# Patient Record
Sex: Female | Born: 1945
Health system: Southern US, Community
[De-identification: ages and names within clinical notes are randomized; demographics above are authoritative.]

## PROBLEM LIST (undated history)

## (undated) DIAGNOSIS — K760 Fatty (change of) liver, not elsewhere classified: Secondary | ICD-10-CM

## (undated) DIAGNOSIS — I10 Essential (primary) hypertension: Secondary | ICD-10-CM

## (undated) DIAGNOSIS — T7840XA Allergy, unspecified, initial encounter: Secondary | ICD-10-CM

## (undated) DIAGNOSIS — M1990 Unspecified osteoarthritis, unspecified site: Secondary | ICD-10-CM

## (undated) DIAGNOSIS — R911 Solitary pulmonary nodule: Secondary | ICD-10-CM

## (undated) DIAGNOSIS — K635 Polyp of colon: Secondary | ICD-10-CM

## (undated) DIAGNOSIS — D259 Leiomyoma of uterus, unspecified: Secondary | ICD-10-CM

## (undated) DIAGNOSIS — E079 Disorder of thyroid, unspecified: Secondary | ICD-10-CM

## (undated) DIAGNOSIS — K219 Gastro-esophageal reflux disease without esophagitis: Secondary | ICD-10-CM

## (undated) HISTORY — DX: Unspecified osteoarthritis, unspecified site: M19.90

## (undated) HISTORY — DX: Disorder of thyroid, unspecified: E07.9

## (undated) HISTORY — DX: Essential (primary) hypertension: I10

## (undated) HISTORY — DX: Leiomyoma of uterus, unspecified: D25.9

## (undated) HISTORY — PX: EYE SURGERY: SHX253

## (undated) HISTORY — DX: Gastro-esophageal reflux disease without esophagitis: K21.9

## (undated) HISTORY — DX: Polyp of colon: K63.5

## (undated) HISTORY — DX: Fatty (change of) liver, not elsewhere classified: K76.0

## (undated) HISTORY — DX: Allergy, unspecified, initial encounter: T78.40XA

## (undated) HISTORY — DX: Solitary pulmonary nodule: R91.1

## (undated) HISTORY — PX: DILATION AND CURETTAGE OF UTERUS: SHX78

## (undated) HISTORY — PX: OTHER SURGICAL HISTORY: SHX169

## (undated) HISTORY — PX: CATARACT EXTRACTION, BILATERAL: SHX1313

---

## 1997-05-17 ENCOUNTER — Other Ambulatory Visit: Admission: RE | Admit: 1997-05-17 | Discharge: 1997-05-17 | Payer: Self-pay | Admitting: Obstetrics and Gynecology

## 1998-10-07 ENCOUNTER — Other Ambulatory Visit: Admission: RE | Admit: 1998-10-07 | Discharge: 1998-10-07 | Payer: Self-pay | Admitting: Obstetrics and Gynecology

## 1999-11-17 ENCOUNTER — Other Ambulatory Visit: Admission: RE | Admit: 1999-11-17 | Discharge: 1999-11-17 | Payer: Self-pay | Admitting: Obstetrics and Gynecology

## 2001-01-05 ENCOUNTER — Other Ambulatory Visit: Admission: RE | Admit: 2001-01-05 | Discharge: 2001-01-05 | Payer: Self-pay | Admitting: Obstetrics and Gynecology

## 2001-01-26 HISTORY — PX: OTHER SURGICAL HISTORY: SHX169

## 2001-04-28 ENCOUNTER — Encounter: Admission: RE | Admit: 2001-04-28 | Discharge: 2001-04-28 | Payer: Self-pay | Admitting: Obstetrics and Gynecology

## 2001-04-28 ENCOUNTER — Encounter: Payer: Self-pay | Admitting: Obstetrics and Gynecology

## 2001-09-29 ENCOUNTER — Ambulatory Visit (HOSPITAL_BASED_OUTPATIENT_CLINIC_OR_DEPARTMENT_OTHER): Admission: RE | Admit: 2001-09-29 | Discharge: 2001-09-29 | Payer: Self-pay | Admitting: Orthopedic Surgery

## 2001-12-27 ENCOUNTER — Other Ambulatory Visit: Admission: RE | Admit: 2001-12-27 | Discharge: 2001-12-27 | Payer: Self-pay | Admitting: Obstetrics and Gynecology

## 2002-03-27 ENCOUNTER — Other Ambulatory Visit: Admission: RE | Admit: 2002-03-27 | Discharge: 2002-03-27 | Payer: Self-pay | Admitting: Obstetrics and Gynecology

## 2002-10-17 ENCOUNTER — Other Ambulatory Visit: Admission: RE | Admit: 2002-10-17 | Discharge: 2002-10-17 | Payer: Self-pay | Admitting: Obstetrics and Gynecology

## 2003-02-07 ENCOUNTER — Ambulatory Visit (HOSPITAL_COMMUNITY): Admission: RE | Admit: 2003-02-07 | Discharge: 2003-02-07 | Payer: Self-pay | Admitting: *Deleted

## 2004-04-02 ENCOUNTER — Other Ambulatory Visit: Admission: RE | Admit: 2004-04-02 | Discharge: 2004-04-02 | Payer: Self-pay | Admitting: Obstetrics and Gynecology

## 2005-04-30 ENCOUNTER — Ambulatory Visit (HOSPITAL_BASED_OUTPATIENT_CLINIC_OR_DEPARTMENT_OTHER): Admission: RE | Admit: 2005-04-30 | Discharge: 2005-04-30 | Payer: Self-pay | Admitting: Orthopedic Surgery

## 2006-04-29 ENCOUNTER — Other Ambulatory Visit: Admission: RE | Admit: 2006-04-29 | Discharge: 2006-04-29 | Payer: Self-pay | Admitting: Obstetrics and Gynecology

## 2007-10-14 ENCOUNTER — Other Ambulatory Visit: Admission: RE | Admit: 2007-10-14 | Discharge: 2007-10-14 | Payer: Self-pay | Admitting: Obstetrics and Gynecology

## 2009-05-13 ENCOUNTER — Other Ambulatory Visit: Admission: RE | Admit: 2009-05-13 | Discharge: 2009-05-13 | Payer: Self-pay | Admitting: Obstetrics and Gynecology

## 2010-06-13 NOTE — Op Note (Signed)
Cynthia Barnett, Cynthia Barnett NO.:  [id number]   MEDICAL RECORD NO.:  [id number]          PATIENT TYPE:  AMB   LOCATION:  DSC                          FACILITY:  [facility]   PHYSICIAN:  Cindee Salt, M.D.       DATE OF BIRTH:  1945-05-05   DATE OF PROCEDURE:  04/30/2005  DATE OF DISCHARGE:                                 OPERATIVE REPORT   PREOPERATIVE DIAGNOSIS:  Stenosing tenosynovitis, left thumb.   POSTOPERATIVE DIAGNOSIS:  Stenosing tenosynovitis, left thumb.   OPERATION:  Release A1 pulley, left thumb.   SURGEON:  Cindee Salt, M.D.   ASSISTANT:  None.   ANESTHESIA:  Forearm based IV regional.   HISTORY:  The patient is a 65 year old female with a history of triggering  of her left thumb.  This is locked.  She is admitted now for release.   PROCEDURE:  The patient is brought to the operating room where a forearm  based IV regional anesthetic was carried out without difficulty.  She was  prepped using DuraPrep, supine position, left arm free.  A transverse  incision was made over the A1 pulley, carried down through subcutaneous  tissue.  Radial and ulnar neurovascular bundles identified and protected to  the radial side of the A1 pulley and incision was made releasing the A1  pulley and noted tight nodule was present within the flexor tendon.  On  release there was complete flexion, complete extension.  The wound was  irrigated.  Skin was closed with interrupted 5-0 nylon sutures.  Sterile  compressive dressing was applied.  The patient tolerated the procedure well,  was taken to the recovery observation in satisfactory condition.  She is  discharged home to return to the [facility] [place] in 1 week on  Vicodin.           ______________________________  Cindee Salt, M.D.     GK/MEDQ  D:  04/30/2005  T:  05/01/2005  Job:  [id number]

## 2010-06-13 NOTE — Op Note (Signed)
NAMEMARTYNA, THORNS                          ACCOUNT NO.:  [id number]   MEDICAL RECORD NO.:  [id number]                   PATIENT TYPE:  AMB   LOCATION:  DSC                                  FACILITY:  [facility]   PHYSICIAN:  Nicki Reaper, M.D.                 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   POSTOPERATIVE DIAGNOSIS:  Locked trigger thumb and carpal tunnel syndrome,  right hand.   OPERATION:  Release of A-1 pulley, right thumb; carpal tunnel release, right  hand.   SURGEON:  Nicki Reaper, M.D.   ASSISTANT:  Joaquin Courts, R.N.   ANESTHESIA:  Forearm-based IV regional.   HISTORY:  The patient is a 65 year old female with a history of carpal  tunnel syndrome and a locked trigger thumb not responsive to conservative  treatment.   DESCRIPTION OF PROCEDURE:  The patient was brought to the operating room  where a forearm-based IV regional anesthetic was carried out without  difficulty with the right arm free.  A longitudinal incision was made in the  palm, carried down through subcutaneous tissue, bleeders were  electrocauterized, palmar fascia was split, superficial palmar arch  identified and flexor tendons to the ring and little fingers identified.  To  the ulnar side of the median nerve the carpal retinaculum was incised with  sharp dissection.  A right-angle and Sewall retractor were placed between  skin and forearm fascia and the fascia was released for approximately 3 cm  proximal to the wrist crease under direct vision.  Considerable scarring  about the nerve was present.  Tenosynovial tissue was moderately thickened.  The wound was irrigated and the skin was closed with interrupted 5-0 nylon  sutures.  A separate incision was then made transversely over the A-1 pulley  and carried through  subcutaneous tissue, neurovascular structures protected.  The A-1 pulley was identified, with a large nodule present in the flexor  tendon proximal to this.  With flexion, this was able to be released and  release performed on the radial aspect.  The oblique pulley was left intact.  The entire A-1 pulley was released.  The finger was able to be passed  through a full range of motion; no further triggering was identified.  The  wound was irrigated.  The skin was closed with interrupted 5-0 nylon suture.  A sterile compressive dressing and splint were applied.  The patient  tolerated the procedure well and was taken to the recovery room for  observation in satisfactory condition.   She is discharged home to return to the [facility] [place] in one  week on Vicoprofen and Keflex.  Nicki Reaper, M.D.    GRK/MEDQ  D:  09/29/2001  T:  09/30/2001  Job:  [id number]

## 2010-12-10 ENCOUNTER — Other Ambulatory Visit: Payer: Self-pay | Admitting: Family Medicine

## 2010-12-10 DIAGNOSIS — E049 Nontoxic goiter, unspecified: Secondary | ICD-10-CM

## 2011-01-06 ENCOUNTER — Ambulatory Visit
Admission: RE | Admit: 2011-01-06 | Discharge: 2011-01-06 | Disposition: A | Discharge status: Home / Self Care | Payer: BC Managed Care – PPO | Source: Ambulatory Visit | Attending: Family Medicine | Admitting: Family Medicine

## 2011-01-06 DIAGNOSIS — E049 Nontoxic goiter, unspecified: Secondary | ICD-10-CM

## 2011-05-08 DIAGNOSIS — N898 Other specified noninflammatory disorders of vagina: Secondary | ICD-10-CM | POA: Diagnosis not present

## 2011-05-08 DIAGNOSIS — N760 Acute vaginitis: Secondary | ICD-10-CM | POA: Diagnosis not present

## 2011-05-08 DIAGNOSIS — Z0000 Encounter for general adult medical examination without abnormal findings: Secondary | ICD-10-CM | POA: Diagnosis not present

## 2011-05-08 DIAGNOSIS — Z124 Encounter for screening for malignant neoplasm of cervix: Secondary | ICD-10-CM | POA: Diagnosis not present

## 2011-06-05 ENCOUNTER — Encounter: Payer: Self-pay | Admitting: Internal Medicine

## 2011-06-05 DIAGNOSIS — D259 Leiomyoma of uterus, unspecified: Secondary | ICD-10-CM | POA: Insufficient documentation

## 2011-06-05 DIAGNOSIS — Z0000 Encounter for general adult medical examination without abnormal findings: Secondary | ICD-10-CM | POA: Insufficient documentation

## 2011-06-09 ENCOUNTER — Other Ambulatory Visit (INDEPENDENT_AMBULATORY_CARE_PROVIDER_SITE_OTHER): Payer: Medicare Other

## 2011-06-09 ENCOUNTER — Encounter: Payer: Self-pay | Admitting: Internal Medicine

## 2011-06-09 ENCOUNTER — Ambulatory Visit (INDEPENDENT_AMBULATORY_CARE_PROVIDER_SITE_OTHER)
Admission: RE | Admit: 2011-06-09 | Discharge: 2011-06-09 | Disposition: A | Discharge status: Home / Self Care | Payer: Medicare Other | Source: Ambulatory Visit

## 2011-06-09 ENCOUNTER — Ambulatory Visit (INDEPENDENT_AMBULATORY_CARE_PROVIDER_SITE_OTHER): Payer: Medicare Other | Admitting: Internal Medicine

## 2011-06-09 VITALS — BP 122/80 | HR 81 | Temp 98.7°F | Ht 63.5 in | Wt 221.1 lb

## 2011-06-09 DIAGNOSIS — E039 Hypothyroidism, unspecified: Secondary | ICD-10-CM | POA: Insufficient documentation

## 2011-06-09 DIAGNOSIS — Z0000 Encounter for general adult medical examination without abnormal findings: Secondary | ICD-10-CM

## 2011-06-09 DIAGNOSIS — E1140 Type 2 diabetes mellitus with diabetic neuropathy, unspecified: Secondary | ICD-10-CM | POA: Insufficient documentation

## 2011-06-09 DIAGNOSIS — I10 Essential (primary) hypertension: Secondary | ICD-10-CM | POA: Diagnosis not present

## 2011-06-09 DIAGNOSIS — IMO0001 Reserved for inherently not codable concepts without codable children: Secondary | ICD-10-CM

## 2011-06-09 DIAGNOSIS — M1990 Unspecified osteoarthritis, unspecified site: Secondary | ICD-10-CM | POA: Insufficient documentation

## 2011-06-09 DIAGNOSIS — N959 Unspecified menopausal and perimenopausal disorder: Secondary | ICD-10-CM

## 2011-06-09 DIAGNOSIS — K635 Polyp of colon: Secondary | ICD-10-CM | POA: Insufficient documentation

## 2011-06-09 DIAGNOSIS — J309 Allergic rhinitis, unspecified: Secondary | ICD-10-CM | POA: Insufficient documentation

## 2011-06-09 LAB — CBC WITH DIFFERENTIAL/PLATELET
Basophils Relative: 0.5 % (ref 0.0–3.0)
Eosinophils Absolute: 0.3 10*3/uL (ref 0.0–0.7)
Eosinophils Relative: 3.8 % (ref 0.0–5.0)
Hemoglobin: 13.1 g/dL (ref 12.0–15.0)
Lymphocytes Relative: 32.3 % (ref 12.0–46.0)
MCHC: 33.5 g/dL (ref 30.0–36.0)
Neutro Abs: 3.7 10*3/uL (ref 1.4–7.7)
RBC: 4.41 Mil/uL (ref 3.87–5.11)

## 2011-06-09 LAB — LIPID PANEL
LDL Cholesterol: 53 mg/dL (ref 0–99)
Total CHOL/HDL Ratio: 2
Triglycerides: 40 mg/dL (ref 0.0–149.0)

## 2011-06-09 LAB — HEMOGLOBIN A1C: Hgb A1c MFr Bld: 7.4 % — ABNORMAL HIGH (ref 4.6–6.5)

## 2011-06-09 LAB — BASIC METABOLIC PANEL
CO2: 29 mEq/L (ref 19–32)
Calcium: 9.4 mg/dL (ref 8.4–10.5)
Creatinine, Ser: 0.7 mg/dL (ref 0.4–1.2)

## 2011-06-09 LAB — URINALYSIS, ROUTINE W REFLEX MICROSCOPIC
Specific Gravity, Urine: 1.025 (ref 1.000–1.030)
Total Protein, Urine: NEGATIVE
Urine Glucose: NEGATIVE
pH: 6 (ref 5.0–8.0)

## 2011-06-09 LAB — MICROALBUMIN / CREATININE URINE RATIO
Creatinine,U: 77.4 mg/dL
Microalb Creat Ratio: 1.5 mg/g (ref 0.0–30.0)

## 2011-06-09 LAB — HEPATIC FUNCTION PANEL
Bilirubin, Direct: 0.1 mg/dL (ref 0.0–0.3)
Total Bilirubin: 0.4 mg/dL (ref 0.3–1.2)
Total Protein: 7.4 g/dL (ref 6.0–8.3)

## 2011-06-09 MED ORDER — HYDROCHLOROTHIAZIDE 25 MG PO TABS: 25.0000 mg | ORAL_TABLET | Freq: Every day | ORAL | Status: DC

## 2011-06-09 MED ORDER — GLIPIZIDE 10 MG PO TABS: 10.0000 mg | ORAL_TABLET | Freq: Every day | ORAL | Status: DC

## 2011-06-09 MED ORDER — METFORMIN HCL 500 MG PO TABS: 500.0000 mg | ORAL_TABLET | Freq: Four times a day (QID) | ORAL | Status: DC

## 2011-06-09 MED ORDER — LEVOTHYROXINE SODIUM 112 MCG PO TABS: 112.0000 ug | ORAL_TABLET | Freq: Every day | ORAL | Status: DC

## 2011-06-09 NOTE — Progress Notes (Signed)
Subjective:    Patient ID: Cynthia Barnett, female    DOB: May 19, 1945, 67 y.o.   MRN: [id number]  HPI  Here to establish as new pt; overall doing ok,  Pt denies chest pain, increased sob or doe, wheezing, orthopnea, PND, increased LE swelling, palpitations, dizziness or syncope.  Pt denies new neurological symptoms such as new headache, or facial or extremity weakness or numbness   Pt denies polydipsia, polyuria, or low sugar symptoms such as weakness or confusion improved with po intake.  Pt states overall good compliance with meds, trying to follow lower cholesterol, diabetic diet, wt overall stable but little exercise however.   Last a1c about 6.7 per pt about 5 months ago.  Denies hyper or hypo thyroid symptoms such as voice, skin or hair change. Does have sense of ongoing fatigue, but denies signficant hypersomnolence.  Still working full time and part time (2 jobs) to make ends meet.  Needs records forwarded    Past Medical History  Diagnosis Date  . Uterine fibroid   . Colon polyps   . Allergy   . Arthritis   . Diabetes mellitus   . Hypertension   . Thyroid disease    Past Surgical History  Procedure Date  . Thumb surgury 2003    dr Merlyn Lot  . Carpal tunnel about 2005    right, Dr Merlyn Lot    reports that she has quit smoking. She has never used smokeless tobacco. She reports that she drinks alcohol. She reports that she does not use illicit drugs. family history includes Arthritis in her other; Cancer in her other; Diabetes in her other; Heart disease in her other; and Hypertension in her other. Allergies  Allergen Reactions  . Sulfa Antibiotics    Current Outpatient Prescriptions on File Prior to Visit  Medication Sig Dispense Refill  . glipiZIDE (GLUCOTROL) 10 MG tablet Take 1 tablet (10 mg total) by mouth daily.  90 tablet  3  . hydrochlorothiazide (HYDRODIURIL) 25 MG tablet Take 1 tablet (25 mg total) by mouth daily.  90 tablet  3  . levothyroxine (SYNTHROID, LEVOTHROID) 112 MCG  tablet Take 1 tablet (112 mcg total) by mouth daily.  90 tablet  3  . metFORMIN (GLUCOPHAGE) 500 MG tablet Take 1 tablet (500 mg total) by mouth 4 (four) times daily.  360 tablet  3   Review of Systems Review of Systems  Constitutional: Negative for diaphoresis, activity change, appetite change and unexpected weight change.  HENT: Negative for hearing loss, ear pain, facial swelling, mouth sores and neck stiffness.   Eyes: Negative for pain, redness and visual disturbance.  Respiratory: Negative for shortness of breath and wheezing.   Cardiovascular: Negative for chest pain and palpitations.  Gastrointestinal: Negative for diarrhea, blood in stool, abdominal distention and rectal pain.  Genitourinary: Negative for hematuria, flank pain and decreased urine volume.  Musculoskeletal: Negative for myalgias and joint swelling.  Skin: Negative for color change and wound.  Neurological: Negative for syncope and numbness.  Hematological: Negative for adenopathy.  Psychiatric/Behavioral: Negative for hallucinations, self-injury, decreased concentration and agitation.      Objective:   Physical Exam BP 122/80  Pulse 81  Temp(Src) 98.7 F (37.1 C) (Oral)  Ht 5' 3.5" (1.613 m)  Wt 221 lb 2 oz (100.302 kg)  BMI 38.56 kg/m2  SpO2 97% Physical Exam  VS noted Constitutional: Pt is oriented to person, place, and time. Appears well-developed and well-nourished.  HENT:  Head: Normocephalic and atraumatic.  Right Ear:  External ear normal.  Left Ear: External ear normal.  Nose: Nose normal.  Mouth/Throat: Oropharynx is clear and moist.  Eyes: Conjunctivae and EOM are normal. Pupils are equal, round, and reactive to light.  Neck: Normal range of motion. Neck supple. No JVD present. No tracheal deviation present.  Cardiovascular: Normal rate, regular rhythm, normal heart sounds and intact distal pulses.   Pulmonary/Chest: Effort normal and breath sounds normal.  Abdominal: Soft. Bowel sounds are  normal. There is no tenderness.  Musculoskeletal: Normal range of motion. Exhibits no edema.  Lymphadenopathy:  Has no cervical adenopathy.  Neurological: Pt is alert and oriented to person, place, and time. Pt has normal reflexes. No cranial nerve deficit.  Skin: Skin is warm and dry. No rash noted.  Psychiatric:  Has  normal mood and affect. Behavior is normal. 1+ nervous    Assessment & Plan:

## 2011-06-09 NOTE — Assessment & Plan Note (Signed)
Not charged today, up to date with immunizations;  Last mammogram may 2012, pap neg April 2013 - Dr Huel Cote

## 2011-06-09 NOTE — Patient Instructions (Signed)
Please sign the Release of information form for the [facility] Please start the Aspirin 81mg  per day  - COATED only Continue all other medications as before You are given the refills today Please go to LAB in the Basement for the blood and/or urine tests to be done today You will be contacted by phone if any changes need to be made immediately.  Otherwise, you will receive a letter about your results with an explanation. Please see the schedulers regarding scheduling of the Bone Density test Please return in 6 months, or sooner if needed

## 2011-06-13 ENCOUNTER — Encounter: Payer: Self-pay | Admitting: Internal Medicine

## 2011-06-13 NOTE — Assessment & Plan Note (Signed)
stable overall by hx and exam, most recent data reviewed with pt, and pt to continue medical treatment as before BP Readings from Last 3 Encounters:  06/09/11 122/80

## 2011-06-13 NOTE — Assessment & Plan Note (Signed)
duer for dxa - to be ordered,   to f/u any worsening symptoms or concerns

## 2011-06-13 NOTE — Assessment & Plan Note (Signed)
stable overall by hx and exam, most recent data reviewed with pt, and pt to continue medical treatment as before Lab Results  Component Value Date   TSH 1.68 06/09/2011

## 2011-06-13 NOTE — Assessment & Plan Note (Signed)
stable overall by hx and exam, most recent data reviewed with pt, and pt to continue medical treatment as before Lab Results  Component Value Date   HGBA1C 7.4* 06/09/2011

## 2011-06-23 ENCOUNTER — Telehealth: Payer: Self-pay | Admitting: Internal Medicine

## 2011-06-23 NOTE — Telephone Encounter (Signed)
Received 18 pages. Sent to Dr. Jonny Ruiz. sd 06/23/11

## 2011-06-26 ENCOUNTER — Encounter: Payer: Self-pay | Admitting: Internal Medicine

## 2011-07-06 ENCOUNTER — Telehealth: Payer: Self-pay

## 2011-07-06 MED ORDER — GLIPIZIDE ER 10 MG PO TB24: 10.0000 mg | ORAL_TABLET | Freq: Every day | ORAL | Status: DC

## 2011-07-06 NOTE — Telephone Encounter (Signed)
Pharmacy called requesting clarification on Glipizide. Pt has previously been taking extended release 10 mg tab but Rx received 05/14 was for plain. Please advise.

## 2011-07-06 NOTE — Telephone Encounter (Signed)
rx corrected - done erx 

## 2011-07-31 DIAGNOSIS — D239 Other benign neoplasm of skin, unspecified: Secondary | ICD-10-CM | POA: Diagnosis not present

## 2011-07-31 DIAGNOSIS — L919 Hypertrophic disorder of the skin, unspecified: Secondary | ICD-10-CM | POA: Diagnosis not present

## 2011-07-31 DIAGNOSIS — L723 Sebaceous cyst: Secondary | ICD-10-CM | POA: Diagnosis not present

## 2011-07-31 DIAGNOSIS — L909 Atrophic disorder of skin, unspecified: Secondary | ICD-10-CM | POA: Diagnosis not present

## 2011-07-31 DIAGNOSIS — L821 Other seborrheic keratosis: Secondary | ICD-10-CM | POA: Diagnosis not present

## 2011-10-21 DIAGNOSIS — Z1231 Encounter for screening mammogram for malignant neoplasm of breast: Secondary | ICD-10-CM | POA: Diagnosis not present

## 2011-11-16 DIAGNOSIS — Z23 Encounter for immunization: Secondary | ICD-10-CM | POA: Diagnosis not present

## 2011-12-08 ENCOUNTER — Ambulatory Visit: Payer: Medicare Other | Admitting: Internal Medicine

## 2012-01-06 ENCOUNTER — Ambulatory Visit: Payer: Medicare Other | Admitting: Internal Medicine

## 2012-01-22 ENCOUNTER — Other Ambulatory Visit: Payer: Self-pay | Admitting: *Deleted

## 2012-01-22 DIAGNOSIS — M25561 Pain in right knee: Secondary | ICD-10-CM

## 2012-01-27 LAB — HM MAMMOGRAPHY: HM Mammogram: NORMAL

## 2012-01-28 ENCOUNTER — Ambulatory Visit
Admission: RE | Admit: 2012-01-28 | Discharge: 2012-01-28 | Disposition: A | Discharge status: Home / Self Care | Payer: Medicare Other | Source: Ambulatory Visit | Attending: Sports Medicine | Admitting: Sports Medicine

## 2012-01-28 DIAGNOSIS — M1710 Unilateral primary osteoarthritis, unspecified knee: Secondary | ICD-10-CM | POA: Diagnosis not present

## 2012-02-03 ENCOUNTER — Ambulatory Visit (INDEPENDENT_AMBULATORY_CARE_PROVIDER_SITE_OTHER): Payer: Medicare Other | Admitting: Sports Medicine

## 2012-02-03 ENCOUNTER — Encounter: Payer: Self-pay | Admitting: Sports Medicine

## 2012-02-03 VITALS — BP 126/79 | HR 76 | Ht 63.0 in

## 2012-02-03 DIAGNOSIS — M1710 Unilateral primary osteoarthritis, unspecified knee: Secondary | ICD-10-CM | POA: Diagnosis not present

## 2012-02-03 DIAGNOSIS — IMO0002 Reserved for concepts with insufficient information to code with codable children: Secondary | ICD-10-CM

## 2012-02-03 DIAGNOSIS — M25569 Pain in unspecified knee: Secondary | ICD-10-CM

## 2012-02-03 DIAGNOSIS — M1711 Unilateral primary osteoarthritis, right knee: Secondary | ICD-10-CM

## 2012-02-03 NOTE — Progress Notes (Signed)
  Subjective:    Patient ID: Cynthia Barnett, female    DOB: 27-Sep-1945, 67 y.o.   MRN: [id number]  HPI chief complaint: Right knee pain   67 year old female comes in today requesting repeat Supartz injections. She is a patient of mine for [facility]. I've seen her in the past for knee osteoarthritis. She's had cortisone injections as well as Visco supplementation. She does very well with Visco supplementation. Last series was done about a year and a half ago. Right knee pain began to return a few weeks ago. It is identical in nature to what she has experienced previously. No recent trauma. Some swelling. Since my last visit with her she has stopped working at [employer] so she is not having to stand as much as she once was. She's had a total of 3 previous Supartz injections in the right knee. No injections in the left knee. Her knee pain is worse with standing and improves with sitting. No mechanical symptom.  Past medical history significant for hypertension, diabetes, hypothyroidism Medications are reviewed She is allergic to sulfa drugs  Socially she does not smoke, drinks a glass of wine once a week, and works as an Print production planner    Review of Systems     Objective:   Physical Exam Well-developed, well-nourished. No acute distress. Awake alert and oriented x3. Vital signs are reviewed  Right knee: Full range of motion. Trace effusion. 1+ patellofemoral crepitus. Slight tenderness to palpation along the medial joint line. Negative McMurray's. Joint is stable to ligamentous exam. Neurovascular intact distally.  Left knee: Full range of motion. No effusion. 1+ patellofemoral crepitus. No joint line tenderness. Negative McMurray's. Joint is stable to ligamentous exam. Neurovascular intact distally .  Patient walks with a slight limp.  X-rays including standing AP and lateral views of each knee are reviewed. Films are dated 01/28/2011. There are changes in the medial  compartment of each knee consistent with mild DJD. Nothing acute is seen.      Assessment & Plan:  1. Right knee pain secondary to DJD  Will start a new round of Supartz today. First injection was done using an anterior lateral approach. Patient tolerated this without difficulty. Followup next week for the second injection. We will plan on doing a total of 3. Call with questions or concerns in the interim.

## 2012-02-10 ENCOUNTER — Ambulatory Visit (INDEPENDENT_AMBULATORY_CARE_PROVIDER_SITE_OTHER): Payer: Medicare Other | Admitting: Internal Medicine

## 2012-02-10 ENCOUNTER — Encounter: Payer: Self-pay | Admitting: Internal Medicine

## 2012-02-10 ENCOUNTER — Other Ambulatory Visit (INDEPENDENT_AMBULATORY_CARE_PROVIDER_SITE_OTHER): Payer: Medicare Other

## 2012-02-10 VITALS — BP 132/78 | HR 77 | Temp 98.9°F | Ht 63.0 in | Wt 219.2 lb

## 2012-02-10 DIAGNOSIS — Z0000 Encounter for general adult medical examination without abnormal findings: Secondary | ICD-10-CM

## 2012-02-10 DIAGNOSIS — H9192 Unspecified hearing loss, left ear: Secondary | ICD-10-CM | POA: Insufficient documentation

## 2012-02-10 DIAGNOSIS — H9190 Unspecified hearing loss, unspecified ear: Secondary | ICD-10-CM | POA: Diagnosis not present

## 2012-02-10 DIAGNOSIS — IMO0001 Reserved for inherently not codable concepts without codable children: Secondary | ICD-10-CM

## 2012-02-10 DIAGNOSIS — J309 Allergic rhinitis, unspecified: Secondary | ICD-10-CM

## 2012-02-10 DIAGNOSIS — I10 Essential (primary) hypertension: Secondary | ICD-10-CM

## 2012-02-10 LAB — BASIC METABOLIC PANEL
BUN: 21 mg/dL (ref 6–23)
CO2: 27 mEq/L (ref 19–32)
Calcium: 9.5 mg/dL (ref 8.4–10.5)
Creatinine, Ser: 0.7 mg/dL (ref 0.4–1.2)

## 2012-02-10 LAB — LIPID PANEL
HDL: 57.1 mg/dL (ref >39.00)
Total CHOL/HDL Ratio: 2
Triglycerides: 37 mg/dL (ref 0.0–149.0)

## 2012-02-10 MED ORDER — FEXOFENADINE HCL 180 MG PO TABS: 180.0000 mg | ORAL_TABLET | Freq: Every day | ORAL | Status: DC

## 2012-02-10 NOTE — Progress Notes (Signed)
Subjective:    Patient ID: Cynthia Barnett, female    DOB: Jun 17, 1945, 67 y.o.   MRN: [id number]  HPI  Here to f/u; overall doing ok,  Pt denies chest pain, increased sob or doe, wheezing, orthopnea, PND, increased LE swelling, palpitations, dizziness or syncope.  Pt denies polydipsia, polyuria, or low sugar symptoms such as weakness or confusion improved with po intake.  Pt denies new neurological symptoms such as new headache, or facial or extremity weakness or numbness.   Pt states overall good compliance with meds, has been trying to follow lower cholesterol, diabetic diet, with overall stable,  but little exercise however.  Has been trying to follow a lower chol diet, but with more meats.  Has had worsening left hearing loss in the past week, without pain, veritgo, fever.  Does have several wks ongoing nasal allergy symptoms with clearish congestion, itch and sneezing, without fever, pain, ST, cough, swelling or wheezing. Past Medical History  Diagnosis Date  . Uterine fibroid   . Colon polyps   . Allergy   . Arthritis   . Diabetes mellitus   . Hypertension   . Thyroid disease    Past Surgical History  Procedure Date  . Thumb surgury 2003    dr Merlyn Lot  . Carpal tunnel about 2005    right, Dr Merlyn Lot    reports that she has quit smoking. She has never used smokeless tobacco. She reports that she drinks alcohol. She reports that she does not use illicit drugs. family history includes Arthritis in her other; Cancer in her other; Diabetes in her other; Heart disease in her other; and Hypertension in her other. Allergies  Allergen Reactions  . Sulfa Antibiotics    Current Outpatient Prescriptions on File Prior to Visit  Medication Sig Dispense Refill  . glipiZIDE (GLUCOTROL XL) 10 MG 24 hr tablet Take 1 tablet (10 mg total) by mouth daily.  90 tablet  3  . hydrochlorothiazide (HYDRODIURIL) 25 MG tablet Take 1 tablet (25 mg total) by mouth daily.  90 tablet  3  . levothyroxine (SYNTHROID,  LEVOTHROID) 112 MCG tablet Take 1 tablet (112 mcg total) by mouth daily.  90 tablet  3  . metFORMIN (GLUCOPHAGE) 500 MG tablet Take 1 tablet (500 mg total) by mouth 4 (four) times daily.  360 tablet  3  . Misc Natural Products (TART CHERRY ADVANCED PO) Take by mouth daily.      . Multiple Vitamin (MULTIVITAMIN) tablet Take 1 tablet by mouth daily.      . Omega-3 Fatty Acids (OMEGA 3 PO) Take by mouth daily.      . fexofenadine (ALLEGRA) 180 MG tablet Take 1 tablet (180 mg total) by mouth daily.  90 tablet  3   Review of Systems  Constitutional: Negative for unexpected weight change, or unusual diaphoresis  HENT: Negative for tinnitus.   Eyes: Negative for photophobia and visual disturbance.  Respiratory: Negative for choking and stridor.   Gastrointestinal: Negative for vomiting and blood in stool.  Genitourinary: Negative for hematuria and decreased urine volume.  Musculoskeletal: Negative for acute joint swelling Skin: Negative for color change and wound.  Neurological: Negative for tremors and numbness other than noted  Psychiatric/Behavioral: Negative for decreased concentration or  hyperactivity.       Objective:   Physical Exam BP 132/78  Pulse 77  Temp 98.9 F (37.2 C) (Oral)  Ht 5\' 3"  (1.6 m)  Wt 219 lb 4 oz (99.451 kg)  BMI 38.84 kg/m2  SpO2 97% VS noted,  Constitutional: Pt appears well-developed and well-nourished.  HENT: Head: NCAT.  Right Ear: External ear normal.  Left Ear: External ear normal.  Left canal irrigated of wax, hearing improved Bilat tm's without erythema.  Max sinus areas non tender.  Pharynx with mild erythema, no exudate Eyes: Conjunctivae and EOM are normal. Pupils are equal, round, and reactive to light.  Neck: Normal range of motion. Neck supple.  Cardiovascular: Normal rate and regular rhythm.   Pulmonary/Chest: Effort normal and breath sounds normal.  Neurological: Pt is alert. Not confused  Skin: Skin is warm. No erythema.  Psychiatric:  Pt behavior is normal. Thought content normal.     Assessment & Plan:

## 2012-02-10 NOTE — Assessment & Plan Note (Signed)
With hearin gimproved s/o irrigation,  to f/u any worsening symptoms or concerns

## 2012-02-10 NOTE — Assessment & Plan Note (Addendum)
ECG reviewed as per emr, stable overall by history and exam, recent data reviewed with pt, and pt to continue medical treatment as before,  to f/u any worsening symptoms or concerns BP Readings from Last 3 Encounters:  02/10/12 132/78  02/03/12 126/79  06/09/11 122/80

## 2012-02-10 NOTE — Assessment & Plan Note (Signed)
Ok to add allegra qd prn,  to f/u any worsening symptoms or concerns

## 2012-02-10 NOTE — Patient Instructions (Addendum)
Your left ear was irrigated of wax today Please take all new medication as prescribed Please continue all other medications as before, and refills have been done if requested. Please go to the LAB in the Basement (turn left off the elevator) for the tests to be done today You will be contacted by phone if any changes need to be made immediately.  Otherwise, you will receive a letter about your results with an explanation, but please check with MyChart first. Thank you for enrolling in MyChart. Please follow the instructions below to securely access your online medical record. MyChart allows you to send messages to your doctor, view your test results, renew your prescriptions, schedule appointments, and more. To Log into My Chart online, please go by [employer] or [employer] to [email or web address], or download the MyChart App from the [employer] of [employer] .  Your Username is:  [email or web address] (pass beezer) Please send a practice email message on Mychart later today.  Please return in 6 months, or sooner if needed

## 2012-02-10 NOTE — Assessment & Plan Note (Signed)
stable overall by history and exam, recent data reviewed with pt, and pt to continue medical treatment as before,  to f/u any worsening symptoms or concerns Lab Results  Component Value Date   HGBA1C 7.4* 06/09/2011   For labs today, cont wt los efforts

## 2012-02-11 ENCOUNTER — Ambulatory Visit (INDEPENDENT_AMBULATORY_CARE_PROVIDER_SITE_OTHER): Payer: Medicare Other | Admitting: Sports Medicine

## 2012-02-11 VITALS — BP 147/85 | Ht 63.0 in | Wt 200.0 lb

## 2012-02-11 DIAGNOSIS — IMO0002 Reserved for concepts with insufficient information to code with codable children: Secondary | ICD-10-CM | POA: Diagnosis not present

## 2012-02-11 DIAGNOSIS — M1710 Unilateral primary osteoarthritis, unspecified knee: Secondary | ICD-10-CM

## 2012-02-11 NOTE — Progress Notes (Signed)
  Subjective:    Patient ID: Cynthia Barnett, female    DOB: 05-10-1945, 67 y.o.   MRN: [id number]  HPI Patient comes in today for her second Supartz injection into her right knee. She tolerated the first injection last week without any difficulty. She is leaving in a couple weeks for trip to [place]. She would like to have a prescription for hydrocodone just in case her knee bothers her while she is away. She has had good success with this in the past.    Review of Systems     Objective:   Physical Exam Unchanged from last week's exam       Assessment & Plan:  1. Right knee pain secondary to DJD  Patient's right knee is injected with her second Supartz injection. Patient tolerated this without difficulty. She will followup with me next week for her third injection. I will give her a refill on her hydrocodone to take with her on her trip. She will call with questions or concerns in the interim.  Consent obtained and verified. Time-out conducted. Noted no overlying erythema, induration, or other signs of local infection. Skin prepped in a sterile fashion. Topical analgesic spray: Ethyl chloride. Joint: right knee, anterior medial approach Needle: 22g 1 1/2 inch Completed without difficulty. Meds: 3cc 1% xylocaine, 1 vial supartz  Advised to call if fevers/chills, erythema, induration, drainage, or persistent bleeding.

## 2012-02-18 ENCOUNTER — Ambulatory Visit (INDEPENDENT_AMBULATORY_CARE_PROVIDER_SITE_OTHER): Payer: Medicare Other | Admitting: Sports Medicine

## 2012-02-18 VITALS — BP 128/78 | Ht 63.0 in | Wt 200.0 lb

## 2012-02-18 DIAGNOSIS — M1710 Unilateral primary osteoarthritis, unspecified knee: Secondary | ICD-10-CM

## 2012-02-18 DIAGNOSIS — IMO0002 Reserved for concepts with insufficient information to code with codable children: Secondary | ICD-10-CM | POA: Diagnosis not present

## 2012-02-18 MED ORDER — HYDROCODONE-ACETAMINOPHEN 5-325 MG PO TABS: ORAL_TABLET | ORAL | Status: DC

## 2012-02-18 NOTE — Progress Notes (Signed)
  Subjective:    Patient ID: Cynthia Barnett, female    DOB: 12/01/1945, 67 y.o.   MRN: [id number]  HPI Patient comes in today for her third Supartz injection into her right knee. She is already beginning to experience some symptom relief. She is leaving next week for a cruise. She is requesting a refill on her hydrocodone. She has taken this responsibly in the past.    Review of Systems     Objective:   Physical Exam Unchanged from previous exam       Assessment & Plan:  1. Right knee pain secondary to DJD  Patient's right knee is injected today with her third Supartz injection. This is done using an anterior medial approach. Patient tolerated this without difficulty. Prescription for Vicodin 5/320 was provided for her to take on a when necessary basis. We will tentatively schedule a followup appointment in 4 weeks. If patient's symptoms have resolved she may cancel the appointment. If symptoms persist however, he will consider completing the series of injections.

## 2012-03-12 ENCOUNTER — Other Ambulatory Visit: Payer: Self-pay

## 2012-03-17 ENCOUNTER — Ambulatory Visit (INDEPENDENT_AMBULATORY_CARE_PROVIDER_SITE_OTHER): Payer: Medicare Other | Admitting: Sports Medicine

## 2012-03-17 ENCOUNTER — Encounter: Payer: Self-pay | Admitting: Sports Medicine

## 2012-03-17 VITALS — BP 125/77 | HR 84 | Ht 63.0 in | Wt 200.0 lb

## 2012-03-17 DIAGNOSIS — IMO0002 Reserved for concepts with insufficient information to code with codable children: Secondary | ICD-10-CM | POA: Diagnosis not present

## 2012-03-17 DIAGNOSIS — M179 Osteoarthritis of knee, unspecified: Secondary | ICD-10-CM

## 2012-03-17 DIAGNOSIS — M1710 Unilateral primary osteoarthritis, unspecified knee: Secondary | ICD-10-CM

## 2012-03-18 NOTE — Progress Notes (Signed)
  Subjective:    Patient ID: Cynthia Barnett, female    DOB: 16-Jun-1945, 67 y.o.   MRN: [id number]  HPI Patient comes in today for followup. She has completed 3 Supartz injections and was doing very well until her trip to [place]. She did quite a bit of walking and her pain has begun to return. She would like to go ahead and complete the Supartz series.    Review of Systems     Objective:   Physical Exam Well-developed, well-nourished. No acute distress.  Right knee: Full range of motion. No effusion. Tenderness along the medial joint line but a negative McMurray's. Neurovascular intact distally. Walking with a slight limp.       Assessment & Plan:  1. Right knee pain secondary to DJD  Patient's knee was injected with her fourth Supartz injection. This was done using an anterior medial approach. She will return next week for her fifth and final injection. Call with questions or concerns in the interim.  Consent obtained and verified. Time-out conducted. Noted no overlying erythema, induration, or other signs of local infection. Skin prepped in a sterile fashion. Topical analgesic spray: Ethyl chloride. Joint: right knee Needle: 22g 11/2 inch Completed without difficulty. Meds: 3cc 1% lidocaine followed by 1 vial of supartz  Advised to call if fevers/chills, erythema, induration, drainage, or persistent bleeding.

## 2012-03-23 ENCOUNTER — Encounter: Payer: Self-pay | Admitting: Sports Medicine

## 2012-03-23 ENCOUNTER — Ambulatory Visit (INDEPENDENT_AMBULATORY_CARE_PROVIDER_SITE_OTHER): Payer: Medicare Other | Admitting: Sports Medicine

## 2012-03-23 VITALS — BP 119/82 | HR 76 | Ht 63.0 in | Wt 200.0 lb

## 2012-03-23 DIAGNOSIS — M1710 Unilateral primary osteoarthritis, unspecified knee: Secondary | ICD-10-CM

## 2012-03-23 DIAGNOSIS — IMO0002 Reserved for concepts with insufficient information to code with codable children: Secondary | ICD-10-CM

## 2012-03-23 NOTE — Progress Notes (Signed)
Patient ID: Cynthia Barnett, female   DOB: 1945/12/31, 67 y.o.   MRN: [id number]  Patient comes in today for her fifth and final Supartz injection into her right knee.  Examination of the right knee is unchanged from previous exams with the exception of a small ecchymotic area in the medial aspect of the knee from last week's injection. No signs of infection.  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 This is done atraumatically using an anterior lateral approach. We can repeat the series in 6 months or longer as long as her insurance continues to cover this procedure. She will followup when necessary.  Consent obtained and verified. Time-out conducted. Noted no overlying erythema, induration, or other signs of local infection. Skin prepped in a sterile fashion. Topical analgesic spray: Ethyl chloride. Joint: right knee Needle: 22g 1 1/2 inch Completed without difficulty. Meds: 3cc 1% lidocaine, 1 vial supartz  Advised to call if fevers/chills, erythema, induration, drainage, or persistent bleeding.

## 2012-07-04 ENCOUNTER — Other Ambulatory Visit: Payer: Self-pay | Admitting: Internal Medicine

## 2012-08-10 ENCOUNTER — Ambulatory Visit: Payer: Medicare Other | Admitting: Internal Medicine

## 2012-08-25 ENCOUNTER — Encounter: Payer: Self-pay | Admitting: Internal Medicine

## 2012-08-25 ENCOUNTER — Other Ambulatory Visit (INDEPENDENT_AMBULATORY_CARE_PROVIDER_SITE_OTHER): Payer: Medicare Other

## 2012-08-25 ENCOUNTER — Other Ambulatory Visit: Payer: Self-pay | Admitting: Internal Medicine

## 2012-08-25 ENCOUNTER — Ambulatory Visit (INDEPENDENT_AMBULATORY_CARE_PROVIDER_SITE_OTHER): Payer: Medicare Other | Admitting: Internal Medicine

## 2012-08-25 VITALS — BP 140/80 | HR 76 | Temp 97.9°F | Ht 63.0 in | Wt 223.5 lb

## 2012-08-25 DIAGNOSIS — IMO0001 Reserved for inherently not codable concepts without codable children: Secondary | ICD-10-CM

## 2012-08-25 DIAGNOSIS — E785 Hyperlipidemia, unspecified: Secondary | ICD-10-CM

## 2012-08-25 DIAGNOSIS — I10 Essential (primary) hypertension: Secondary | ICD-10-CM | POA: Diagnosis not present

## 2012-08-25 DIAGNOSIS — E039 Hypothyroidism, unspecified: Secondary | ICD-10-CM

## 2012-08-25 DIAGNOSIS — K219 Gastro-esophageal reflux disease without esophagitis: Secondary | ICD-10-CM

## 2012-08-25 HISTORY — DX: Gastro-esophageal reflux disease without esophagitis: K21.9

## 2012-08-25 LAB — URINALYSIS, ROUTINE W REFLEX MICROSCOPIC
Bilirubin Urine: NEGATIVE
Hgb urine dipstick: NEGATIVE
Ketones, ur: NEGATIVE
Leukocytes, UA: NEGATIVE
Nitrite: NEGATIVE
Urobilinogen, UA: 0.2 (ref 0.0–1.0)

## 2012-08-25 LAB — HEPATIC FUNCTION PANEL
AST: 58 U/L — ABNORMAL HIGH (ref 0–37)
Alkaline Phosphatase: 67 U/L (ref 39–117)
Bilirubin, Direct: 0.1 mg/dL (ref 0.0–0.3)

## 2012-08-25 LAB — BASIC METABOLIC PANEL
Calcium: 9.7 mg/dL (ref 8.4–10.5)
GFR: 91.85 mL/min (ref >60.00)
Glucose, Bld: 225 mg/dL — ABNORMAL HIGH (ref 70–99)
Potassium: 4.2 mEq/L (ref 3.5–5.1)
Sodium: 137 mEq/L (ref 135–145)

## 2012-08-25 LAB — CBC WITH DIFFERENTIAL/PLATELET
Basophils Absolute: 0 10*3/uL (ref 0.0–0.1)
Eosinophils Absolute: 0.3 10*3/uL (ref 0.0–0.7)
Eosinophils Relative: 4.4 % (ref 0.0–5.0)
HCT: 40 % (ref 36.0–46.0)
Lymphs Abs: 1.8 10*3/uL (ref 0.7–4.0)
MCV: 90.1 fl (ref 78.0–100.0)
Monocytes Absolute: 0.4 10*3/uL (ref 0.1–1.0)
Neutrophils Relative %: 58.3 % (ref 43.0–77.0)
Platelets: 272 10*3/uL (ref 150.0–400.0)
RDW: 14.7 % — ABNORMAL HIGH (ref 11.5–14.6)
WBC: 6.1 10*3/uL (ref 4.5–10.5)

## 2012-08-25 LAB — TSH: TSH: 9.7 u[IU]/mL — ABNORMAL HIGH (ref 0.35–5.50)

## 2012-08-25 LAB — LIPID PANEL
HDL: 50.5 mg/dL (ref >39.00)
Total CHOL/HDL Ratio: 2
VLDL: 8.2 mg/dL (ref 0.0–40.0)

## 2012-08-25 LAB — MICROALBUMIN / CREATININE URINE RATIO
Creatinine,U: 114.4 mg/dL
Microalb Creat Ratio: 1.3 mg/g (ref 0.0–30.0)

## 2012-08-25 LAB — HEMOGLOBIN A1C: Hgb A1c MFr Bld: 9 % — ABNORMAL HIGH (ref 4.6–6.5)

## 2012-08-25 MED ORDER — METFORMIN HCL 500 MG PO TABS: 500.0000 mg | ORAL_TABLET | Freq: Four times a day (QID) | ORAL | Status: DC

## 2012-08-25 MED ORDER — LEVOTHYROXINE SODIUM 112 MCG PO TABS: 112.0000 ug | ORAL_TABLET | Freq: Every day | ORAL | Status: DC

## 2012-08-25 MED ORDER — LEVOTHYROXINE SODIUM 125 MCG PO TABS: 125.0000 ug | ORAL_TABLET | Freq: Every day | ORAL | Status: DC

## 2012-08-25 MED ORDER — HYDROCHLOROTHIAZIDE 25 MG PO TABS: 25.0000 mg | ORAL_TABLET | Freq: Every day | ORAL | Status: DC

## 2012-08-25 MED ORDER — SITAGLIPTIN PHOSPHATE 100 MG PO TABS: 100.0000 mg | ORAL_TABLET | Freq: Every day | ORAL | Status: DC

## 2012-08-25 NOTE — Assessment & Plan Note (Signed)
Ok to cont pepcid ac prn,  to f/u any worsening symptoms or concerns

## 2012-08-25 NOTE — Patient Instructions (Signed)
Please continue all other medications as before, and refills have been done if requested. Please have the pharmacy call with any other refills you may need.  Please continue your efforts at being more active, low cholesterol diet, and weight control.  You are otherwise up to date with prevention measures today.  Please go to the LAB in the Basement (turn left off the elevator) for the tests to be done today You will be contacted by phone if any changes need to be made immediately.  Otherwise, you will receive a letter about your results with an explanation, but please check with MyChart first.  Please remember to sign up for MyChart if you have not done so, as this will be important to you in the future with finding out test results, communicating by private email, and scheduling acute appointments online when needed.  Please return in 6 months, or sooner if needed, with Lab testing done 3-5 days before  

## 2012-08-25 NOTE — Assessment & Plan Note (Signed)
stable overall by history and exam, recent data reviewed with pt, and pt to continue medical treatment as before,  to f/u any worsening symptoms or concerns Lab Results  Component Value Date   HGBA1C 7.5* 02/10/2012

## 2012-08-25 NOTE — Assessment & Plan Note (Signed)
stable overall by history and exam, recent data reviewed with pt, and pt to continue medical treatment as before,  to f/u any worsening symptoms or concerns BP Readings from Last 3 Encounters:  08/25/12 140/80  03/23/12 119/82  03/17/12 125/77

## 2012-08-25 NOTE — Assessment & Plan Note (Signed)
stable overall by history and exam, recent data reviewed with pt, and pt to continue medical treatment as before,  to f/u any worsening symptoms or concerns Lab Results  Component Value Date   TSH 1.68 06/09/2011   For f/u lab

## 2012-08-25 NOTE — Progress Notes (Signed)
  Subjective:    Patient ID: Cynthia Barnett, female    DOB: 19-Jan-1946, 67 y.o.   MRN: [id number]  HPI  Here for f/u;  Overall doing ok;  Pt denies CP, worsening SOB, DOE, wheezing, orthopnea, PND, worsening LE edema, palpitations, dizziness or syncope.  Pt denies neurological change such as new headache, facial or extremity weakness.  Pt denies polydipsia, polyuria, or low sugar symptoms. Pt states overall good compliance with treatment and medications, good tolerability, and has been trying to follow lower cholesterol diet.  Pt denies worsening depressive symptoms, suicidal ideation or panic. No fever, night sweats, wt loss, loss of appetite, or other constitutional symptoms.  Pt states good ability with ADL's, has low fall risk, home safety reviewed and adequate, no other significant changes in hearing or vision, and only occasionally active with exercise.  BP borderline but has ran out of med yesterday. Has had mild worsening reflux better with pepcid AC prn, without abd pain, dysphagia, n/v, bowel change or blood, tyring to watch the anti-rfelux diet.  Past Medical History  Diagnosis Date  . Uterine fibroid   . Colon polyps   . Allergy   . Arthritis   . Diabetes mellitus   . Hypertension   . Thyroid disease    Past Surgical History  Procedure Laterality Date  . Thumb surgury  2003    dr Merlyn Lot  . Carpal tunnel  about 2005    right, Dr Merlyn Lot    reports that she has quit smoking. She has never used smokeless tobacco. She reports that  drinks alcohol. She reports that she does not use illicit drugs. family history includes Arthritis in her other; Cancer in her other; Diabetes in her other; Heart disease in her other; and Hypertension in her other. Allergies  Allergen Reactions  . Sulfa Antibiotics    Current Outpatient Prescriptions on File Prior to Visit  Medication Sig Dispense Refill  . GLIPIZIDE XL 10 MG 24 hr tablet take 1 tablet by mouth once daily  90 tablet  1   No current  facility-administered medications on file prior to visit.   Review of Systems  Constitutional: Negative for unexpected weight change, or unusual diaphoresis  HENT: Negative for tinnitus.   Eyes: Negative for photophobia and visual disturbance.  Respiratory: Negative for choking and stridor.   Gastrointestinal: Negative for vomiting and blood in stool.  Genitourinary: Negative for hematuria and decreased urine volume.  Musculoskeletal: Negative for acute joint swelling Skin: Negative for color change and wound.  Neurological: Negative for tremors and numbness other than noted  Psychiatric/Behavioral: Negative for decreased concentration or  hyperactivity.       Objective:   Physical Exam BP 140/80  Pulse 76  Temp(Src) 97.9 F (36.6 C) (Oral)  Ht 5\' 3"  (1.6 m)  Wt 223 lb 8 oz (101.379 kg)  BMI 39.6 kg/m2  SpO2 97% VS noted,  Constitutional: Pt appears well-developed and well-nourished.  HENT: Head: NCAT.  Right Ear: External ear normal.  Left Ear: External ear normal.  Eyes: Conjunctivae and EOM are normal. Pupils are equal, round, and reactive to light.  Neck: Normal range of motion. Neck supple.  Cardiovascular: Normal rate and regular rhythm.   Pulmonary/Chest: Effort normal and breath sounds normal.  Abd:  Soft, NT, non-distended, + BS Neurological: Pt is alert. Not confused  Skin: Skin is warm. No erythema.  Psychiatric: Pt behavior is normal. Thought content normal.     Assessment & Plan:

## 2012-08-31 ENCOUNTER — Other Ambulatory Visit: Payer: Self-pay

## 2012-11-09 ENCOUNTER — Encounter: Payer: Self-pay | Admitting: Sports Medicine

## 2012-11-09 ENCOUNTER — Ambulatory Visit (INDEPENDENT_AMBULATORY_CARE_PROVIDER_SITE_OTHER): Payer: Medicare Other | Admitting: Sports Medicine

## 2012-11-09 VITALS — BP 162/89 | Ht 63.5 in | Wt 190.0 lb

## 2012-11-09 DIAGNOSIS — M1710 Unilateral primary osteoarthritis, unspecified knee: Secondary | ICD-10-CM

## 2012-11-09 DIAGNOSIS — IMO0002 Reserved for concepts with insufficient information to code with codable children: Secondary | ICD-10-CM | POA: Diagnosis not present

## 2012-11-09 DIAGNOSIS — M179 Osteoarthritis of knee, unspecified: Secondary | ICD-10-CM

## 2012-11-09 MED ORDER — SODIUM HYALURONATE (VISCOSUP) 25 MG/2.5ML IX SOLN
2.5000 mL | Freq: Once | INTRA_ARTICULAR | Status: AC
  Administered 2012-11-09: 2.5 mL via INTRA_ARTICULAR

## 2012-11-09 NOTE — Progress Notes (Signed)
  Subjective:    Patient ID: Cynthia Barnett, female    DOB: [birth year], 67 y.o.   MRN: [id number]  HPI chief complaint: Right knee pain  Patient comes in today with returning right knee pain. She has a history of right knee DJD. She has had several series of Visco supplementation each providing her with good symptom relief. She would like to start another round of Supartz today. Her pain began to return about 3 months ago after she was caring for a friend's dogs while he was on vacation. She was having to walk up and down a lot of stairs. She is noticing some swelling in the right knee. Pain is diffuse. No trauma. Interim medical history and current medications are reviewed    Review of Systems     Objective:   Physical Exam Well-developed, well-nourished. No acute distress. Awake alert and oriented x3   Right knee: Range of motion 0-120. Trace effusion. Tender to palpation along the medial joint line but a negative McMurray's. Slight tenderness along the lateral joint line as well. Slight patellofemoral crepitus. Good joint stability. Neurovascularly intact distally. Walking with a slight limp.  X-rays from January 22 2012 showed tricompartmental DJD in each knee       Assessment & Plan:  Returning right knee pain secondary to DJD  Patient's right knee was injected with her first Supartz injection today. This was done atraumatically under sterile technique utilizing an anterior lateral approach. Flash of synovial fluid was obtained prior to injection of Supartz. She will return to the office next week for her second injection. We will initially plan on 3 injections with a followup appointment one month later. She still has some hydrocodone left over from last year which she takes very rarely. Call with questions or concerns prior to her next office visit.  Consent obtained and verified. Time-out conducted. Noted no overlying erythema, induration, or other signs of local  infection. Skin prepped in a sterile fashion. Topical analgesic spray: Ethyl chloride. Joint: right knee Needle: 22g 1.5 inch Completed without difficulty. Meds: 3cc 1% xylocaine injected followed by one vial of supartz  Advised to call if fevers/chills, erythema, induration, drainage, or persistent bleeding.

## 2012-11-16 ENCOUNTER — Encounter: Payer: Self-pay | Admitting: Sports Medicine

## 2012-11-16 ENCOUNTER — Ambulatory Visit (INDEPENDENT_AMBULATORY_CARE_PROVIDER_SITE_OTHER): Payer: Medicare Other | Admitting: Sports Medicine

## 2012-11-16 VITALS — BP 140/80 | Ht 63.5 in | Wt 190.0 lb

## 2012-11-16 DIAGNOSIS — M1711 Unilateral primary osteoarthritis, right knee: Secondary | ICD-10-CM

## 2012-11-16 DIAGNOSIS — IMO0002 Reserved for concepts with insufficient information to code with codable children: Secondary | ICD-10-CM | POA: Diagnosis not present

## 2012-11-16 DIAGNOSIS — M1710 Unilateral primary osteoarthritis, unspecified knee: Secondary | ICD-10-CM

## 2012-11-16 MED ORDER — SODIUM HYALURONATE (VISCOSUP) 25 MG/2.5ML IX SOSY
2.5000 mL | PREFILLED_SYRINGE | Freq: Once | INTRA_ARTICULAR | Status: AC
  Administered 2012-11-16: 2.5 mL via INTRA_ARTICULAR

## 2012-11-16 NOTE — Progress Notes (Signed)
Patient ID: Cynthia Barnett, female   DOB: [birth year], 67 y.o.   MRN: [id number]  Patient comes in today for her second Supartz injection into the right knee. She tolerated the first injection without difficulty. She is without complaint.  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 An anterior lateral approach was utilized. Patient tolerated this without difficulty. A flash of synovial fluid was seen prior to injection. She will return next week for her third injection. Call with questions or concerns in the interim.  Consent obtained and verified. Time-out conducted. Noted no overlying erythema, induration, or other signs of local infection. Skin prepped in a sterile fashion. Topical analgesic spray: Ethyl chloride. Joint: right knee Needle: 22g 1.5 inch Completed without difficulty. Meds: 3cc 1% xylocaine followed by injection of one vial of Supartz  Advised to call if fevers/chills, erythema, induration, drainage, or persistent bleeding.

## 2012-11-23 ENCOUNTER — Encounter: Payer: Self-pay | Admitting: Sports Medicine

## 2012-11-23 ENCOUNTER — Ambulatory Visit (INDEPENDENT_AMBULATORY_CARE_PROVIDER_SITE_OTHER): Payer: Medicare Other | Admitting: Sports Medicine

## 2012-11-23 VITALS — BP 136/90 | HR 81 | Ht 63.5 in | Wt 190.0 lb

## 2012-11-23 DIAGNOSIS — IMO0002 Reserved for concepts with insufficient information to code with codable children: Secondary | ICD-10-CM | POA: Diagnosis not present

## 2012-11-23 DIAGNOSIS — M1710 Unilateral primary osteoarthritis, unspecified knee: Secondary | ICD-10-CM

## 2012-11-23 DIAGNOSIS — M1711 Unilateral primary osteoarthritis, right knee: Secondary | ICD-10-CM

## 2012-11-23 MED ORDER — SODIUM HYALURONATE (VISCOSUP) 25 MG/2.5ML IX SOSY
2.5000 mL | PREFILLED_SYRINGE | Freq: Once | INTRA_ARTICULAR | Status: AC
  Administered 2012-11-23: 2.5 mL via INTRA_ARTICULAR

## 2012-11-23 NOTE — Progress Notes (Signed)
  Subjective:    Patient ID: Cynthia Barnett, female    DOB: 01/10/1946, 67 y.o.   MRN: [id number]  HPI Patient comes in today for her third Supartz injection into her right knee: She is already beginning to experience some symptom relief. She has no complaints.    Review of Systems     Objective:   Physical Exam  Physical exam is unchanged from the previous exam with the exception of the prior small ecchymotic area along the lateral knee having now resolved.      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 An anterior lateral approach was utilized./Of synovial fluid was obtained prior to injection. She tolerated this without difficulty. For now we will leave things open ended and she will see how things go. If needed, we could consider completing the series in a month or so. Otherwise, we could repeat the entire series in 6 months time. Followup when necessary.  Consent obtained and verified. Time-out conducted. Noted no overlying erythema, induration, or other signs of local infection. Skin prepped in a sterile fashion. Topical analgesic spray: Ethyl chloride. Joint: right knee Needle: 22g 1.5 inch Completed without difficulty. Meds: 3cc 1% xylocaine injected followed by injection of one vial of Supartz  Advised to call if fevers/chills, erythema, induration, drainage, or persistent bleeding.

## 2012-12-01 ENCOUNTER — Other Ambulatory Visit: Payer: Self-pay

## 2012-12-08 ENCOUNTER — Encounter: Payer: Self-pay | Admitting: Sports Medicine

## 2012-12-08 ENCOUNTER — Ambulatory Visit (INDEPENDENT_AMBULATORY_CARE_PROVIDER_SITE_OTHER): Payer: Medicare Other | Admitting: Sports Medicine

## 2012-12-08 VITALS — BP 134/83 | HR 80 | Ht 63.5 in | Wt 190.0 lb

## 2012-12-08 DIAGNOSIS — IMO0002 Reserved for concepts with insufficient information to code with codable children: Secondary | ICD-10-CM | POA: Diagnosis not present

## 2012-12-08 DIAGNOSIS — M1711 Unilateral primary osteoarthritis, right knee: Secondary | ICD-10-CM

## 2012-12-08 DIAGNOSIS — M1710 Unilateral primary osteoarthritis, unspecified knee: Secondary | ICD-10-CM

## 2012-12-08 MED ORDER — SODIUM HYALURONATE (VISCOSUP) 25 MG/2.5ML IX SOSY
2.5000 mL | PREFILLED_SYRINGE | Freq: Once | INTRA_ARTICULAR | Status: AC
  Administered 2012-12-08: 2.5 mL via INTRA_ARTICULAR

## 2012-12-09 NOTE — Progress Notes (Signed)
  Subjective:    Patient ID: Cynthia Barnett, female    DOB: 1945/11/21, 67 y.o.   MRN: [id number]  HPI Patient comes in today requesting a fourth Supartz injection into her right knee. She would like to go ahead and complete the series. She is leaving tomorrow for a trip to [place]. Still experiencing pain although she admits that her swelling has improved.    Review of Systems     Objective:   Physical Exam Well-developed, well-nourished. No acute distress.  Right knee: Full range of motion. No effusion. Tender to palpation along medial and lateral joint lines. Neurovascularly intact distally. Walking with a slight limp.       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 She will schedule a followup appointment with me when she returns to town for her fifth and final injection. In the meantime, she takes an occasional hydrocodone as needed for pain as well as ibuprofen.  Consent obtained and verified. Time-out conducted. Noted no overlying erythema, induration, or other signs of local infection. Skin prepped in a sterile fashion. Topical analgesic spray: Ethyl chloride. Joint: right knee, anterior lateral approach Needle: 22g 1.5 inch Completed without difficulty. Meds: 3cc 1% xylocaine, followed by one vial of supartz  Advised to call if fevers/chills, erythema, induration, drainage, or persistent bleeding.

## 2012-12-19 ENCOUNTER — Encounter: Payer: Self-pay | Admitting: Sports Medicine

## 2012-12-19 ENCOUNTER — Ambulatory Visit (INDEPENDENT_AMBULATORY_CARE_PROVIDER_SITE_OTHER): Payer: Medicare Other | Admitting: Sports Medicine

## 2012-12-19 VITALS — BP 164/92 | Ht 64.0 in | Wt 190.0 lb

## 2012-12-19 DIAGNOSIS — M1711 Unilateral primary osteoarthritis, right knee: Secondary | ICD-10-CM

## 2012-12-19 DIAGNOSIS — M1710 Unilateral primary osteoarthritis, unspecified knee: Secondary | ICD-10-CM

## 2012-12-19 DIAGNOSIS — IMO0002 Reserved for concepts with insufficient information to code with codable children: Secondary | ICD-10-CM | POA: Diagnosis not present

## 2012-12-19 MED ORDER — SODIUM HYALURONATE (VISCOSUP) 25 MG/2.5ML IX SOSY
2.5000 mL | PREFILLED_SYRINGE | Freq: Once | INTRA_ARTICULAR | Status: AC
  Administered 2012-12-19: 2.5 mL via INTRA_ARTICULAR

## 2012-12-19 NOTE — Progress Notes (Signed)
  Subjective:    Patient ID: Cynthia Barnett, female    DOB: Jul 08, 1945, 67 y.o.   MRN: [id number]  HPI Patient comes in today for her fifth and final Supartz injection into the right knee. She has recently returned from a trip to [place]. Knee is a little bit sore. No swelling.    Review of Systems     Objective:   Physical Exam Unchanged from the last office visit       Assessment & Plan:  Right knee DJD  Patient's right knee was injected with her fifth and final Supartz injection today. An anterior lateral approach was utilized. Patient tolerated this without difficulty. This completes the series. Patient will followup when necessary.  Consent obtained and verified. Time-out conducted. Noted no overlying erythema, induration, or other signs of local infection. Skin prepped in a sterile fashion. Topical analgesic spray: Ethyl chloride. Joint: right knee Needle: 22g 1.5 inch Completed without difficulty. Meds: 3cc 1% xylocaine followed by one vial of supartz  Advised to call if fevers/chills, erythema, induration, drainage, or persistent bleeding.

## 2013-01-02 ENCOUNTER — Other Ambulatory Visit: Payer: Self-pay | Admitting: Internal Medicine

## 2013-01-02 NOTE — Telephone Encounter (Signed)
Refill done.  

## 2013-01-17 DIAGNOSIS — Z1231 Encounter for screening mammogram for malignant neoplasm of breast: Secondary | ICD-10-CM | POA: Diagnosis not present

## 2013-01-17 DIAGNOSIS — Z803 Family history of malignant neoplasm of breast: Secondary | ICD-10-CM | POA: Diagnosis not present

## 2013-01-17 LAB — HM MAMMOGRAPHY

## 2013-02-22 ENCOUNTER — Ambulatory Visit: Payer: Medicare Other | Admitting: Internal Medicine

## 2013-03-28 ENCOUNTER — Ambulatory Visit (INDEPENDENT_AMBULATORY_CARE_PROVIDER_SITE_OTHER): Payer: Medicare Other | Admitting: Internal Medicine

## 2013-03-28 ENCOUNTER — Other Ambulatory Visit: Payer: Self-pay | Admitting: Internal Medicine

## 2013-03-28 ENCOUNTER — Other Ambulatory Visit (INDEPENDENT_AMBULATORY_CARE_PROVIDER_SITE_OTHER): Payer: Medicare Other

## 2013-03-28 ENCOUNTER — Encounter: Payer: Self-pay | Admitting: Internal Medicine

## 2013-03-28 VITALS — BP 150/82 | HR 81 | Temp 97.9°F | Ht 63.0 in | Wt 224.2 lb

## 2013-03-28 DIAGNOSIS — IMO0001 Reserved for inherently not codable concepts without codable children: Secondary | ICD-10-CM

## 2013-03-28 DIAGNOSIS — I10 Essential (primary) hypertension: Secondary | ICD-10-CM | POA: Diagnosis not present

## 2013-03-28 DIAGNOSIS — E1165 Type 2 diabetes mellitus with hyperglycemia: Principal | ICD-10-CM

## 2013-03-28 DIAGNOSIS — E039 Hypothyroidism, unspecified: Secondary | ICD-10-CM | POA: Diagnosis not present

## 2013-03-28 DIAGNOSIS — Z23 Encounter for immunization: Secondary | ICD-10-CM | POA: Diagnosis not present

## 2013-03-28 LAB — T4, FREE: FREE T4: 0.91 ng/dL (ref 0.60–1.60)

## 2013-03-28 LAB — HEPATIC FUNCTION PANEL
ALBUMIN: 4.1 g/dL (ref 3.5–5.2)
ALT: 80 U/L — AB (ref 0–35)
AST: 108 U/L — ABNORMAL HIGH (ref 0–37)
Alkaline Phosphatase: 101 U/L (ref 39–117)
Bilirubin, Direct: 0.1 mg/dL (ref 0.0–0.3)
TOTAL PROTEIN: 8 g/dL (ref 6.0–8.3)
Total Bilirubin: 0.9 mg/dL (ref 0.3–1.2)

## 2013-03-28 LAB — LIPID PANEL
Cholesterol: 153 mg/dL (ref 0–200)
HDL: 54.2 mg/dL (ref >39.00)
LDL Cholesterol: 88 mg/dL (ref 0–99)
TRIGLYCERIDES: 52 mg/dL (ref 0.0–149.0)
Total CHOL/HDL Ratio: 3
VLDL: 10.4 mg/dL (ref 0.0–40.0)

## 2013-03-28 LAB — BASIC METABOLIC PANEL
BUN: 17 mg/dL (ref 6–23)
CALCIUM: 10 mg/dL (ref 8.4–10.5)
CO2: 30 meq/L (ref 19–32)
CREATININE: 0.8 mg/dL (ref 0.4–1.2)
Chloride: 97 mEq/L (ref 96–112)
GFR: 78.26 mL/min (ref >60.00)
GLUCOSE: 223 mg/dL — AB (ref 70–99)
Potassium: 4 mEq/L (ref 3.5–5.1)
Sodium: 134 mEq/L — ABNORMAL LOW (ref 135–145)

## 2013-03-28 LAB — TSH: TSH: 23.26 u[IU]/mL — ABNORMAL HIGH (ref 0.35–5.50)

## 2013-03-28 LAB — HEMOGLOBIN A1C: HEMOGLOBIN A1C: 9.9 % — AB (ref 4.6–6.5)

## 2013-03-28 MED ORDER — AZELASTINE HCL 0.1 % NA SOLN: 1.0000 | Freq: Two times a day (BID) | NASAL | Status: DC

## 2013-03-28 MED ORDER — LEVOTHYROXINE SODIUM 125 MCG PO TABS: 125.0000 ug | ORAL_TABLET | Freq: Every day | ORAL | Status: DC

## 2013-03-28 MED ORDER — GLIPIZIDE ER 10 MG PO TB24: 10.0000 mg | ORAL_TABLET | Freq: Every day | ORAL | Status: DC

## 2013-03-28 MED ORDER — SITAGLIPTIN PHOSPHATE 100 MG PO TABS
100.0000 mg | ORAL_TABLET | Freq: Every day | ORAL | Status: DC
Start: 2013-03-28 — End: 2016-09-18

## 2013-03-28 MED ORDER — HYDROCHLOROTHIAZIDE 25 MG PO TABS: 25.0000 mg | ORAL_TABLET | Freq: Every day | ORAL | Status: DC

## 2013-03-28 NOTE — Assessment & Plan Note (Addendum)
stable overall by history and exam, recent data reviewed with pt, and pt to continue medical treatment as before,  to f/u any worsening symptoms or concerns Lab Results  Component Value Date   HGBA1C 9.0* 08/25/2012   For f/u lab on new januvia

## 2013-03-28 NOTE — Addendum Note (Signed)
Addended by: Sharon Seller B on: 03/28/2013 10:31 AM   Modules accepted: Orders

## 2013-03-28 NOTE — Progress Notes (Signed)
Subjective:    Patient ID: Cynthia Barnett, female    DOB: 06-02-1945, 68 y.o.   MRN: [id number]  HPI Here to f/u; overall doing ok,  Pt denies chest pain, increased sob or doe, wheezing, orthopnea, PND, increased LE swelling, palpitations, dizziness or syncope.  Pt denies polydipsia, polyuria, or low sugar symptoms such as weakness or confusion improved with po intake.  Pt denies new neurological symptoms such as new headache, or facial or extremity weakness or numbness.   Pt states overall good compliance with meds, has been trying to follow lower cholesterol, diabetic diet, with wt overall stable,  but little exercise however.,  Plans to start CURVES again soon 3 times per wk as she did  Before to hopefully lose wt.  Wants to lose so to get off DM med, so could get better insurance rates.  Asks for astelin for allergies and snoring at night with congestion. Denies hyper or hypo thyroid symptoms such as voice, skin or hair change. BP at home has been < 140/90  Past Medical History  Diagnosis Date  . Uterine fibroid   . Colon polyps   . Allergy   . Arthritis   . Diabetes mellitus   . Hypertension   . Thyroid disease   . GERD (gastroesophageal reflux disease) 08/25/2012   Past Surgical History  Procedure Laterality Date  . Thumb surgury  2003    dr Fredna Dow  . Carpal tunnel  about 2005    right, Dr Fredna Dow    reports that she has quit smoking. She has never used smokeless tobacco. She reports that she drinks alcohol. She reports that she does not use illicit drugs. family history includes Arthritis in her other; Cancer in her other; Diabetes in her other; Heart disease in her other; Hypertension in her other. Allergies  Allergen Reactions  . Sulfa Antibiotics    Current Outpatient Prescriptions on File Prior to Visit  Medication Sig Dispense Refill  . aspirin 81 MG tablet Take 81 mg by mouth daily.      . Cholecalciferol (VITAMIN D-3 PO) Take by mouth daily.      Marland Kitchen CINNAMON PO Take by  mouth.      . metFORMIN (GLUCOPHAGE) 500 MG tablet Take 1 tablet (500 mg total) by mouth 4 (four) times daily.  360 tablet  3   No current facility-administered medications on file prior to visit.   Review of Systems  Constitutional: Negative for unexpected weight change, or unusual diaphoresis  HENT: Negative for tinnitus.   Eyes: Negative for photophobia and visual disturbance.  Respiratory: Negative for choking and stridor.   Gastrointestinal: Negative for vomiting and blood in stool.  Genitourinary: Negative for hematuria and decreased urine volume.  Musculoskeletal: Negative for acute joint swelling Skin: Negative for color change and wound.  Neurological: Negative for tremors and numbness other than noted  Psychiatric/Behavioral: Negative for decreased concentration or  hyperactivity.       Objective:   Physical Exam BP 150/82  Pulse 81  Temp(Src) 97.9 F (36.6 C) (Oral)  Ht 5\' 3"  (1.6 m)  Wt 224 lb 4 oz (101.719 kg)  BMI 39.73 kg/m2  SpO2 96% VS noted,  Constitutional: Pt appears well-developed and well-nourished.  HENT: Head: NCAT.  Right Ear: External ear normal.  Left Ear: External ear normal.  Eyes: Conjunctivae and EOM are normal. Pupils are equal, round, and reactive to light.  Neck: Normal range of motion. Neck supple.  Cardiovascular: Normal rate and regular rhythm.  Pulmonary/Chest: Effort normal and breath sounds normal.  Abd:  Soft, NT, non-distended, + BS Neurological: Pt is alert. Not confused  Skin: Skin is warm. No erythema.  Psychiatric: Pt behavior is normal. Thought content normal.      Assessment & Plan:

## 2013-03-28 NOTE — Assessment & Plan Note (Addendum)
stable overall by history and exam, recent data reviewed with pt, and pt to continue medical treatment as before,  to f/u any worsening symptoms or concerns BP Readings from Last 3 Encounters:  03/28/13 150/82  12/19/12 164/92  12/08/12 134/83   Needs to check more freq at home, recent wt gain may be contributiing to higher blood pressure

## 2013-03-28 NOTE — Assessment & Plan Note (Signed)
stable overall by history and exam, recent data reviewed with pt, and pt to continue medical treatment as before,  to f/u any worsening symptoms or concerns Lab Results  Component Value Date   TSH 9.70* 08/25/2012   For f/u lab

## 2013-03-28 NOTE — Patient Instructions (Addendum)
You had the new Prevnar pneumonia shot Please take all new medication as prescribed - the astelin Please continue all other medications as before, and refills have been done if requested. Please have the pharmacy call with any other refills you may need.  Please consider seeing ENT if the breathe right strips don't work well enough for the snoring  Please return in 6 months, or sooner if needed

## 2013-03-28 NOTE — Progress Notes (Signed)
Pre visit review using our clinic review tool, if applicable. No additional management support is needed unless otherwise documented below in the visit note. 

## 2013-08-01 ENCOUNTER — Telehealth: Payer: Self-pay | Admitting: *Deleted

## 2013-08-01 DIAGNOSIS — IMO0001 Reserved for inherently not codable concepts without codable children: Secondary | ICD-10-CM

## 2013-08-01 DIAGNOSIS — E1165 Type 2 diabetes mellitus with hyperglycemia: Principal | ICD-10-CM

## 2013-08-01 NOTE — Telephone Encounter (Signed)
Patient has an appointment Bmet, a1c ordered Diabetic bundle

## 2013-08-29 ENCOUNTER — Other Ambulatory Visit: Payer: Self-pay | Admitting: Family Medicine

## 2013-08-29 DIAGNOSIS — R1906 Epigastric swelling, mass or lump: Secondary | ICD-10-CM

## 2013-08-29 DIAGNOSIS — I10 Essential (primary) hypertension: Secondary | ICD-10-CM | POA: Diagnosis not present

## 2013-08-29 DIAGNOSIS — M1710 Unilateral primary osteoarthritis, unspecified knee: Secondary | ICD-10-CM | POA: Diagnosis not present

## 2013-08-29 DIAGNOSIS — IMO0001 Reserved for inherently not codable concepts without codable children: Secondary | ICD-10-CM | POA: Diagnosis not present

## 2013-08-29 DIAGNOSIS — R7402 Elevation of levels of lactic acid dehydrogenase (LDH): Secondary | ICD-10-CM | POA: Diagnosis not present

## 2013-08-29 DIAGNOSIS — Z23 Encounter for immunization: Secondary | ICD-10-CM | POA: Diagnosis not present

## 2013-08-29 DIAGNOSIS — Z0000 Encounter for general adult medical examination without abnormal findings: Secondary | ICD-10-CM | POA: Diagnosis not present

## 2013-08-29 DIAGNOSIS — R748 Abnormal levels of other serum enzymes: Secondary | ICD-10-CM

## 2013-08-29 DIAGNOSIS — R011 Cardiac murmur, unspecified: Secondary | ICD-10-CM | POA: Diagnosis not present

## 2013-08-29 DIAGNOSIS — D015 Carcinoma in situ of liver, gallbladder and bile ducts: Secondary | ICD-10-CM | POA: Diagnosis not present

## 2013-09-05 ENCOUNTER — Ambulatory Visit
Admission: RE | Admit: 2013-09-05 | Discharge: 2013-09-05 | Disposition: A | Discharge status: Home / Self Care | Payer: Medicare Other | Source: Ambulatory Visit | Attending: Family Medicine | Admitting: Family Medicine

## 2013-09-05 DIAGNOSIS — R160 Hepatomegaly, not elsewhere classified: Secondary | ICD-10-CM | POA: Diagnosis not present

## 2013-09-05 DIAGNOSIS — N1330 Unspecified hydronephrosis: Secondary | ICD-10-CM | POA: Diagnosis not present

## 2013-09-05 DIAGNOSIS — R1906 Epigastric swelling, mass or lump: Secondary | ICD-10-CM

## 2013-09-05 DIAGNOSIS — K8020 Calculus of gallbladder without cholecystitis without obstruction: Secondary | ICD-10-CM | POA: Diagnosis not present

## 2013-09-05 DIAGNOSIS — R748 Abnormal levels of other serum enzymes: Secondary | ICD-10-CM

## 2013-09-06 ENCOUNTER — Other Ambulatory Visit: Payer: Self-pay | Admitting: Family Medicine

## 2013-09-06 DIAGNOSIS — R945 Abnormal results of liver function studies: Secondary | ICD-10-CM

## 2013-09-06 DIAGNOSIS — R1900 Intra-abdominal and pelvic swelling, mass and lump, unspecified site: Secondary | ICD-10-CM

## 2013-09-06 DIAGNOSIS — R7989 Other specified abnormal findings of blood chemistry: Secondary | ICD-10-CM

## 2013-09-07 ENCOUNTER — Other Ambulatory Visit: Payer: Medicare Other

## 2013-09-12 ENCOUNTER — Ambulatory Visit
Admission: RE | Admit: 2013-09-12 | Discharge: 2013-09-12 | Disposition: A | Discharge status: Home / Self Care | Payer: Medicare Other | Source: Ambulatory Visit | Attending: Family Medicine | Admitting: Family Medicine

## 2013-09-12 DIAGNOSIS — N281 Cyst of kidney, acquired: Secondary | ICD-10-CM | POA: Diagnosis not present

## 2013-09-12 DIAGNOSIS — R1900 Intra-abdominal and pelvic swelling, mass and lump, unspecified site: Secondary | ICD-10-CM

## 2013-09-12 DIAGNOSIS — R7989 Other specified abnormal findings of blood chemistry: Secondary | ICD-10-CM

## 2013-09-12 DIAGNOSIS — R945 Abnormal results of liver function studies: Secondary | ICD-10-CM

## 2013-09-12 DIAGNOSIS — K8020 Calculus of gallbladder without cholecystitis without obstruction: Secondary | ICD-10-CM | POA: Diagnosis not present

## 2013-09-12 DIAGNOSIS — K7689 Other specified diseases of liver: Secondary | ICD-10-CM | POA: Diagnosis not present

## 2013-09-12 MED ORDER — IOHEXOL 300 MG/ML  SOLN
125.0000 mL | Freq: Once | INTRAMUSCULAR | Status: AC | PRN
  Administered 2013-09-12: 125 mL via INTRAVENOUS

## 2013-09-14 ENCOUNTER — Other Ambulatory Visit: Payer: Self-pay | Admitting: Family Medicine

## 2013-09-14 DIAGNOSIS — J984 Other disorders of lung: Secondary | ICD-10-CM

## 2013-09-21 ENCOUNTER — Ambulatory Visit
Admission: RE | Admit: 2013-09-21 | Discharge: 2013-09-21 | Disposition: A | Discharge status: Home / Self Care | Payer: Medicare Other | Source: Ambulatory Visit | Attending: Family Medicine | Admitting: Family Medicine

## 2013-09-21 DIAGNOSIS — R911 Solitary pulmonary nodule: Secondary | ICD-10-CM | POA: Diagnosis not present

## 2013-09-21 DIAGNOSIS — J984 Other disorders of lung: Secondary | ICD-10-CM

## 2013-09-26 ENCOUNTER — Ambulatory Visit: Payer: Medicare Other | Admitting: Internal Medicine

## 2013-10-03 ENCOUNTER — Other Ambulatory Visit: Payer: Self-pay | Admitting: Family Medicine

## 2013-10-03 DIAGNOSIS — R911 Solitary pulmonary nodule: Secondary | ICD-10-CM

## 2013-10-11 DIAGNOSIS — E039 Hypothyroidism, unspecified: Secondary | ICD-10-CM | POA: Diagnosis not present

## 2013-10-26 DIAGNOSIS — E034 Atrophy of thyroid (acquired): Secondary | ICD-10-CM | POA: Diagnosis not present

## 2013-10-26 DIAGNOSIS — Z23 Encounter for immunization: Secondary | ICD-10-CM | POA: Diagnosis not present

## 2013-10-26 DIAGNOSIS — R740 Nonspecific elevation of levels of transaminase and lactic acid dehydrogenase [LDH]: Secondary | ICD-10-CM | POA: Diagnosis not present

## 2013-10-26 DIAGNOSIS — E1165 Type 2 diabetes mellitus with hyperglycemia: Secondary | ICD-10-CM | POA: Diagnosis not present

## 2013-10-26 DIAGNOSIS — I10 Essential (primary) hypertension: Secondary | ICD-10-CM | POA: Diagnosis not present

## 2013-11-27 DIAGNOSIS — Z124 Encounter for screening for malignant neoplasm of cervix: Secondary | ICD-10-CM | POA: Diagnosis not present

## 2013-11-27 DIAGNOSIS — Z01419 Encounter for gynecological examination (general) (routine) without abnormal findings: Secondary | ICD-10-CM | POA: Diagnosis not present

## 2013-12-20 ENCOUNTER — Ambulatory Visit
Admission: RE | Admit: 2013-12-20 | Discharge: 2013-12-20 | Disposition: A | Discharge status: Home / Self Care | Payer: Medicare Other | Source: Ambulatory Visit | Attending: Family Medicine | Admitting: Family Medicine

## 2013-12-20 DIAGNOSIS — R911 Solitary pulmonary nodule: Secondary | ICD-10-CM | POA: Diagnosis not present

## 2013-12-25 DIAGNOSIS — E119 Type 2 diabetes mellitus without complications: Secondary | ICD-10-CM | POA: Diagnosis not present

## 2013-12-25 DIAGNOSIS — E039 Hypothyroidism, unspecified: Secondary | ICD-10-CM | POA: Diagnosis not present

## 2013-12-25 DIAGNOSIS — E031 Congenital hypothyroidism without goiter: Secondary | ICD-10-CM | POA: Diagnosis not present

## 2013-12-25 DIAGNOSIS — R740 Nonspecific elevation of levels of transaminase and lactic acid dehydrogenase [LDH]: Secondary | ICD-10-CM | POA: Diagnosis not present

## 2013-12-28 DIAGNOSIS — E039 Hypothyroidism, unspecified: Secondary | ICD-10-CM | POA: Diagnosis not present

## 2013-12-28 DIAGNOSIS — R011 Cardiac murmur, unspecified: Secondary | ICD-10-CM | POA: Diagnosis not present

## 2013-12-28 DIAGNOSIS — K760 Fatty (change of) liver, not elsewhere classified: Secondary | ICD-10-CM | POA: Diagnosis not present

## 2013-12-28 DIAGNOSIS — E119 Type 2 diabetes mellitus without complications: Secondary | ICD-10-CM | POA: Diagnosis not present

## 2014-01-04 ENCOUNTER — Other Ambulatory Visit (HOSPITAL_COMMUNITY): Payer: Medicare Other

## 2014-01-16 ENCOUNTER — Other Ambulatory Visit (HOSPITAL_COMMUNITY): Payer: Medicare Other

## 2014-01-24 ENCOUNTER — Ambulatory Visit (HOSPITAL_COMMUNITY): Payer: Medicare Other | Attending: Cardiology | Admitting: Radiology

## 2014-01-24 ENCOUNTER — Other Ambulatory Visit (HOSPITAL_COMMUNITY): Payer: Self-pay | Admitting: Family Medicine

## 2014-01-24 DIAGNOSIS — E119 Type 2 diabetes mellitus without complications: Secondary | ICD-10-CM | POA: Diagnosis not present

## 2014-01-24 DIAGNOSIS — E669 Obesity, unspecified: Secondary | ICD-10-CM | POA: Diagnosis not present

## 2014-01-24 DIAGNOSIS — R010 Benign and innocent cardiac murmurs: Secondary | ICD-10-CM

## 2014-01-24 DIAGNOSIS — Z87891 Personal history of nicotine dependence: Secondary | ICD-10-CM | POA: Diagnosis not present

## 2014-01-24 DIAGNOSIS — R011 Cardiac murmur, unspecified: Secondary | ICD-10-CM

## 2014-01-24 DIAGNOSIS — I10 Essential (primary) hypertension: Secondary | ICD-10-CM | POA: Diagnosis not present

## 2014-01-24 NOTE — Progress Notes (Signed)
Echocardiogram performed.  

## 2014-04-09 DIAGNOSIS — H2513 Age-related nuclear cataract, bilateral: Secondary | ICD-10-CM | POA: Diagnosis not present

## 2014-04-27 DIAGNOSIS — L03011 Cellulitis of right finger: Secondary | ICD-10-CM | POA: Diagnosis not present

## 2014-05-14 DIAGNOSIS — R300 Dysuria: Secondary | ICD-10-CM | POA: Diagnosis not present

## 2014-05-14 DIAGNOSIS — R3915 Urgency of urination: Secondary | ICD-10-CM | POA: Diagnosis not present

## 2014-05-14 DIAGNOSIS — R350 Frequency of micturition: Secondary | ICD-10-CM | POA: Diagnosis not present

## 2014-05-18 DIAGNOSIS — R300 Dysuria: Secondary | ICD-10-CM | POA: Diagnosis not present

## 2014-05-18 DIAGNOSIS — R3915 Urgency of urination: Secondary | ICD-10-CM | POA: Diagnosis not present

## 2014-05-24 DIAGNOSIS — H2512 Age-related nuclear cataract, left eye: Secondary | ICD-10-CM | POA: Diagnosis not present

## 2014-05-24 DIAGNOSIS — H25812 Combined forms of age-related cataract, left eye: Secondary | ICD-10-CM | POA: Diagnosis not present

## 2014-06-07 DIAGNOSIS — Z1231 Encounter for screening mammogram for malignant neoplasm of breast: Secondary | ICD-10-CM | POA: Diagnosis not present

## 2014-06-07 DIAGNOSIS — Z803 Family history of malignant neoplasm of breast: Secondary | ICD-10-CM | POA: Diagnosis not present

## 2014-06-18 ENCOUNTER — Other Ambulatory Visit: Payer: Self-pay | Admitting: Internal Medicine

## 2014-06-19 DIAGNOSIS — L304 Erythema intertrigo: Secondary | ICD-10-CM | POA: Diagnosis not present

## 2014-06-19 DIAGNOSIS — L814 Other melanin hyperpigmentation: Secondary | ICD-10-CM | POA: Diagnosis not present

## 2014-06-19 DIAGNOSIS — L821 Other seborrheic keratosis: Secondary | ICD-10-CM | POA: Diagnosis not present

## 2014-06-19 DIAGNOSIS — D2221 Melanocytic nevi of right ear and external auricular canal: Secondary | ICD-10-CM | POA: Diagnosis not present

## 2014-06-19 DIAGNOSIS — L720 Epidermal cyst: Secondary | ICD-10-CM | POA: Diagnosis not present

## 2014-06-19 DIAGNOSIS — D1801 Hemangioma of skin and subcutaneous tissue: Secondary | ICD-10-CM | POA: Diagnosis not present

## 2014-06-19 DIAGNOSIS — I788 Other diseases of capillaries: Secondary | ICD-10-CM | POA: Diagnosis not present

## 2014-06-19 DIAGNOSIS — D225 Melanocytic nevi of trunk: Secondary | ICD-10-CM | POA: Diagnosis not present

## 2014-09-27 ENCOUNTER — Other Ambulatory Visit: Payer: Self-pay | Admitting: Family Medicine

## 2014-09-27 DIAGNOSIS — R911 Solitary pulmonary nodule: Secondary | ICD-10-CM

## 2014-10-03 DIAGNOSIS — Z23 Encounter for immunization: Secondary | ICD-10-CM | POA: Diagnosis not present

## 2014-10-04 ENCOUNTER — Ambulatory Visit
Admission: RE | Admit: 2014-10-04 | Discharge: 2014-10-04 | Disposition: A | Discharge status: Home / Self Care | Payer: Medicare Other | Source: Ambulatory Visit | Attending: Family Medicine | Admitting: Family Medicine

## 2014-10-04 DIAGNOSIS — R911 Solitary pulmonary nodule: Secondary | ICD-10-CM | POA: Diagnosis not present

## 2014-11-09 DIAGNOSIS — E039 Hypothyroidism, unspecified: Secondary | ICD-10-CM | POA: Diagnosis not present

## 2014-11-09 DIAGNOSIS — Z0000 Encounter for general adult medical examination without abnormal findings: Secondary | ICD-10-CM | POA: Diagnosis not present

## 2014-11-09 DIAGNOSIS — Z008 Encounter for other general examination: Secondary | ICD-10-CM | POA: Diagnosis not present

## 2014-11-09 DIAGNOSIS — E119 Type 2 diabetes mellitus without complications: Secondary | ICD-10-CM | POA: Diagnosis not present

## 2014-11-09 DIAGNOSIS — I10 Essential (primary) hypertension: Secondary | ICD-10-CM | POA: Diagnosis not present

## 2014-12-24 DIAGNOSIS — E039 Hypothyroidism, unspecified: Secondary | ICD-10-CM | POA: Diagnosis not present

## 2014-12-31 DIAGNOSIS — H25011 Cortical age-related cataract, right eye: Secondary | ICD-10-CM | POA: Diagnosis not present

## 2015-02-07 DIAGNOSIS — E039 Hypothyroidism, unspecified: Secondary | ICD-10-CM | POA: Diagnosis not present

## 2015-02-12 DIAGNOSIS — H2511 Age-related nuclear cataract, right eye: Secondary | ICD-10-CM | POA: Diagnosis not present

## 2015-02-14 DIAGNOSIS — E039 Hypothyroidism, unspecified: Secondary | ICD-10-CM | POA: Diagnosis not present

## 2015-02-14 DIAGNOSIS — I10 Essential (primary) hypertension: Secondary | ICD-10-CM | POA: Diagnosis not present

## 2015-02-14 DIAGNOSIS — E119 Type 2 diabetes mellitus without complications: Secondary | ICD-10-CM | POA: Diagnosis not present

## 2015-03-20 DIAGNOSIS — H2511 Age-related nuclear cataract, right eye: Secondary | ICD-10-CM | POA: Diagnosis not present

## 2015-03-20 DIAGNOSIS — H25811 Combined forms of age-related cataract, right eye: Secondary | ICD-10-CM | POA: Diagnosis not present

## 2015-05-29 DIAGNOSIS — L723 Sebaceous cyst: Secondary | ICD-10-CM | POA: Diagnosis not present

## 2015-05-29 DIAGNOSIS — L0889 Other specified local infections of the skin and subcutaneous tissue: Secondary | ICD-10-CM | POA: Diagnosis not present

## 2015-05-29 DIAGNOSIS — L720 Epidermal cyst: Secondary | ICD-10-CM | POA: Diagnosis not present

## 2015-05-29 DIAGNOSIS — L02811 Cutaneous abscess of head [any part, except face]: Secondary | ICD-10-CM | POA: Diagnosis not present

## 2015-08-29 DIAGNOSIS — H1033 Unspecified acute conjunctivitis, bilateral: Secondary | ICD-10-CM | POA: Diagnosis not present

## 2015-09-05 DIAGNOSIS — H1031 Unspecified acute conjunctivitis, right eye: Secondary | ICD-10-CM | POA: Diagnosis not present

## 2015-09-19 DIAGNOSIS — H1033 Unspecified acute conjunctivitis, bilateral: Secondary | ICD-10-CM | POA: Diagnosis not present

## 2015-09-19 DIAGNOSIS — Z803 Family history of malignant neoplasm of breast: Secondary | ICD-10-CM | POA: Diagnosis not present

## 2015-09-19 DIAGNOSIS — Z1231 Encounter for screening mammogram for malignant neoplasm of breast: Secondary | ICD-10-CM | POA: Diagnosis not present

## 2015-10-15 DIAGNOSIS — Z23 Encounter for immunization: Secondary | ICD-10-CM | POA: Diagnosis not present

## 2015-10-15 DIAGNOSIS — N951 Menopausal and female climacteric states: Secondary | ICD-10-CM | POA: Diagnosis not present

## 2015-10-15 DIAGNOSIS — E119 Type 2 diabetes mellitus without complications: Secondary | ICD-10-CM | POA: Diagnosis not present

## 2015-10-15 DIAGNOSIS — N3946 Mixed incontinence: Secondary | ICD-10-CM | POA: Diagnosis not present

## 2015-10-15 DIAGNOSIS — M175 Other unilateral secondary osteoarthritis of knee: Secondary | ICD-10-CM | POA: Diagnosis not present

## 2015-10-15 DIAGNOSIS — E669 Obesity, unspecified: Secondary | ICD-10-CM | POA: Diagnosis not present

## 2015-10-15 DIAGNOSIS — E039 Hypothyroidism, unspecified: Secondary | ICD-10-CM | POA: Diagnosis not present

## 2015-11-12 DIAGNOSIS — Z8049 Family history of malignant neoplasm of other genital organs: Secondary | ICD-10-CM | POA: Diagnosis not present

## 2015-11-12 DIAGNOSIS — M810 Age-related osteoporosis without current pathological fracture: Secondary | ICD-10-CM | POA: Diagnosis not present

## 2015-11-12 DIAGNOSIS — Z79890 Hormone replacement therapy (postmenopausal): Secondary | ICD-10-CM | POA: Diagnosis not present

## 2015-11-12 DIAGNOSIS — Z01419 Encounter for gynecological examination (general) (routine) without abnormal findings: Secondary | ICD-10-CM | POA: Diagnosis not present

## 2015-11-12 DIAGNOSIS — R938 Abnormal findings on diagnostic imaging of other specified body structures: Secondary | ICD-10-CM | POA: Diagnosis not present

## 2015-11-12 DIAGNOSIS — N858 Other specified noninflammatory disorders of uterus: Secondary | ICD-10-CM | POA: Diagnosis not present

## 2015-11-12 DIAGNOSIS — M2550 Pain in unspecified joint: Secondary | ICD-10-CM | POA: Diagnosis not present

## 2015-11-20 DIAGNOSIS — E039 Hypothyroidism, unspecified: Secondary | ICD-10-CM | POA: Diagnosis not present

## 2015-11-20 DIAGNOSIS — E1159 Type 2 diabetes mellitus with other circulatory complications: Secondary | ICD-10-CM | POA: Diagnosis not present

## 2015-11-20 DIAGNOSIS — E1165 Type 2 diabetes mellitus with hyperglycemia: Secondary | ICD-10-CM | POA: Diagnosis not present

## 2015-11-20 DIAGNOSIS — I10 Essential (primary) hypertension: Secondary | ICD-10-CM | POA: Diagnosis not present

## 2015-11-20 DIAGNOSIS — E1140 Type 2 diabetes mellitus with diabetic neuropathy, unspecified: Secondary | ICD-10-CM | POA: Diagnosis not present

## 2015-11-22 DIAGNOSIS — E039 Hypothyroidism, unspecified: Secondary | ICD-10-CM | POA: Diagnosis not present

## 2015-11-22 DIAGNOSIS — E1165 Type 2 diabetes mellitus with hyperglycemia: Secondary | ICD-10-CM | POA: Diagnosis not present

## 2015-11-22 DIAGNOSIS — E1140 Type 2 diabetes mellitus with diabetic neuropathy, unspecified: Secondary | ICD-10-CM | POA: Diagnosis not present

## 2015-12-24 DIAGNOSIS — N905 Atrophy of vulva: Secondary | ICD-10-CM | POA: Diagnosis not present

## 2015-12-24 DIAGNOSIS — Z79890 Hormone replacement therapy (postmenopausal): Secondary | ICD-10-CM | POA: Diagnosis not present

## 2015-12-27 DIAGNOSIS — E039 Hypothyroidism, unspecified: Secondary | ICD-10-CM | POA: Diagnosis not present

## 2015-12-31 DIAGNOSIS — E039 Hypothyroidism, unspecified: Secondary | ICD-10-CM | POA: Diagnosis not present

## 2015-12-31 DIAGNOSIS — E1129 Type 2 diabetes mellitus with other diabetic kidney complication: Secondary | ICD-10-CM | POA: Diagnosis not present

## 2015-12-31 DIAGNOSIS — E1165 Type 2 diabetes mellitus with hyperglycemia: Secondary | ICD-10-CM | POA: Diagnosis not present

## 2015-12-31 DIAGNOSIS — E1159 Type 2 diabetes mellitus with other circulatory complications: Secondary | ICD-10-CM | POA: Diagnosis not present

## 2015-12-31 DIAGNOSIS — R809 Proteinuria, unspecified: Secondary | ICD-10-CM | POA: Diagnosis not present

## 2015-12-31 DIAGNOSIS — E1140 Type 2 diabetes mellitus with diabetic neuropathy, unspecified: Secondary | ICD-10-CM | POA: Diagnosis not present

## 2015-12-31 DIAGNOSIS — I10 Essential (primary) hypertension: Secondary | ICD-10-CM | POA: Diagnosis not present

## 2016-03-06 DIAGNOSIS — E039 Hypothyroidism, unspecified: Secondary | ICD-10-CM | POA: Diagnosis not present

## 2016-03-06 DIAGNOSIS — Z79890 Hormone replacement therapy (postmenopausal): Secondary | ICD-10-CM | POA: Diagnosis not present

## 2016-03-06 DIAGNOSIS — R911 Solitary pulmonary nodule: Secondary | ICD-10-CM | POA: Diagnosis not present

## 2016-03-06 DIAGNOSIS — I10 Essential (primary) hypertension: Secondary | ICD-10-CM | POA: Diagnosis not present

## 2016-03-13 DIAGNOSIS — E119 Type 2 diabetes mellitus without complications: Secondary | ICD-10-CM | POA: Diagnosis not present

## 2016-03-13 DIAGNOSIS — Z961 Presence of intraocular lens: Secondary | ICD-10-CM | POA: Diagnosis not present

## 2016-03-13 DIAGNOSIS — H04123 Dry eye syndrome of bilateral lacrimal glands: Secondary | ICD-10-CM | POA: Diagnosis not present

## 2016-03-13 DIAGNOSIS — E039 Hypothyroidism, unspecified: Secondary | ICD-10-CM | POA: Diagnosis not present

## 2016-03-13 DIAGNOSIS — H43811 Vitreous degeneration, right eye: Secondary | ICD-10-CM | POA: Diagnosis not present

## 2016-03-13 LAB — HM DIABETES EYE EXAM

## 2016-03-20 DIAGNOSIS — E039 Hypothyroidism, unspecified: Secondary | ICD-10-CM | POA: Diagnosis not present

## 2016-03-20 DIAGNOSIS — R809 Proteinuria, unspecified: Secondary | ICD-10-CM | POA: Diagnosis not present

## 2016-03-20 DIAGNOSIS — E1129 Type 2 diabetes mellitus with other diabetic kidney complication: Secondary | ICD-10-CM | POA: Diagnosis not present

## 2016-03-20 DIAGNOSIS — E1165 Type 2 diabetes mellitus with hyperglycemia: Secondary | ICD-10-CM | POA: Diagnosis not present

## 2016-03-20 DIAGNOSIS — I10 Essential (primary) hypertension: Secondary | ICD-10-CM | POA: Diagnosis not present

## 2016-03-20 DIAGNOSIS — E1159 Type 2 diabetes mellitus with other circulatory complications: Secondary | ICD-10-CM | POA: Diagnosis not present

## 2016-03-20 DIAGNOSIS — E1140 Type 2 diabetes mellitus with diabetic neuropathy, unspecified: Secondary | ICD-10-CM | POA: Diagnosis not present

## 2016-04-15 IMAGING — CT CT ABD-PELV W/ CM
2 of 5 series · 17 of 46 positions shown, 19 images · IV contrast (READICAT/WATER & [ID] OMNI [id number])
Comparison: None

CLINICAL DATA: Epigastric mass on exam, cholelithiasis, fatty liver
and LEFT hydronephrosis by ultrasound, elevated LFTs, history
hypertension, diabetes, GERD, uterine leiomyomas

EXAM:
CT ABDOMEN AND PELVIS WITH CONTRAST
TECHNIQUE: Multidetector CT imaging of the abdomen and pelvis was performed
using the standard protocol following bolus administration of
intravenous contrast. Sagittal and coronal MPR images reconstructed
from axial data set.
CONTRAST:  125mL OMNIPAQUE IOHEXOL 300 MG/ML SOLN IV. Dilute oral
contrast.

[Series 2: abd/pelvis with · axial · 0.92mm/px · z∈[-414,+6]mm · 14 of 96 slices shown, 16 images]
[im 6/96  soft-tissue]
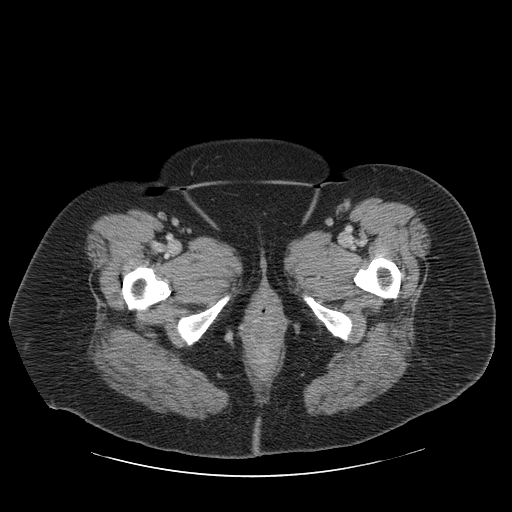
[im 6/96  bone]
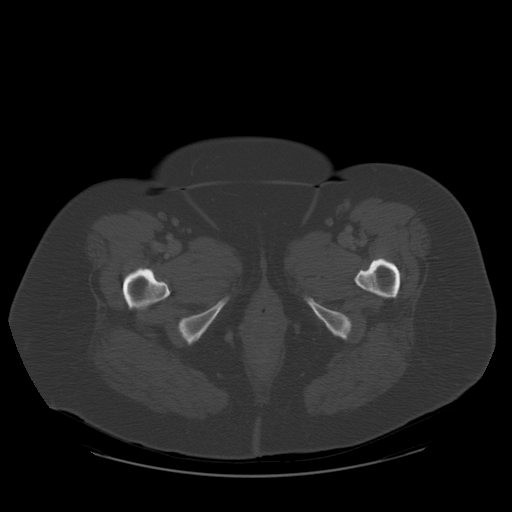
[im 11/96  soft-tissue]
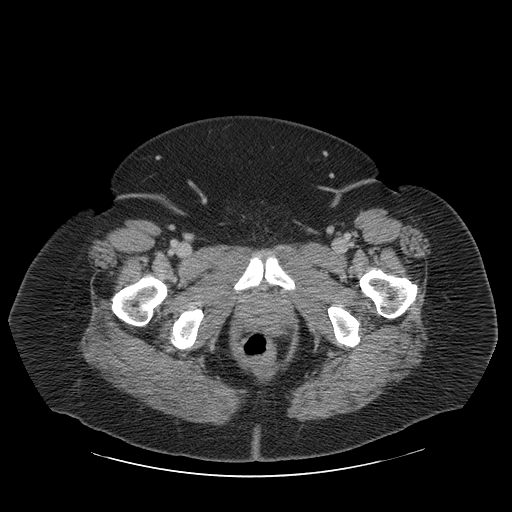
[im 22/96  soft-tissue]
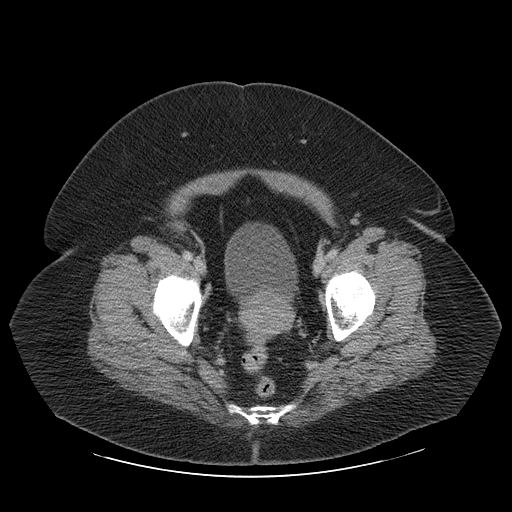
[im 27/96  soft-tissue]
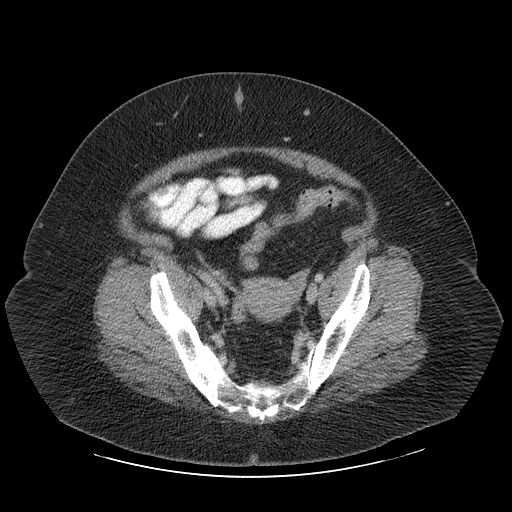
[im 32/96  soft-tissue]
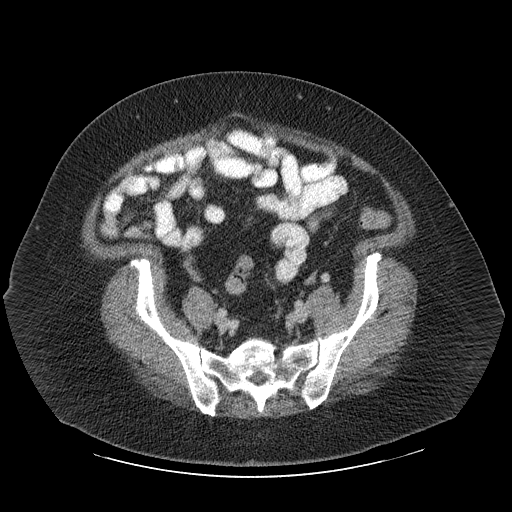
[im 37/96  soft-tissue]
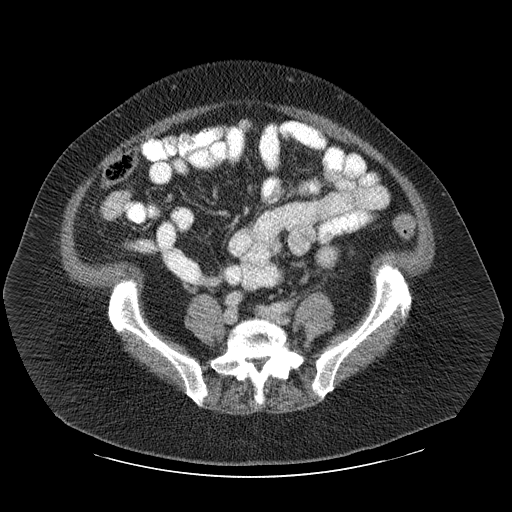
[im 43/96  soft-tissue]
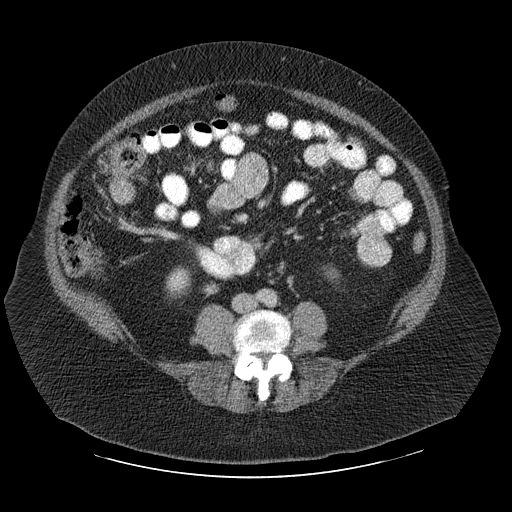
[im 53/96  soft-tissue]
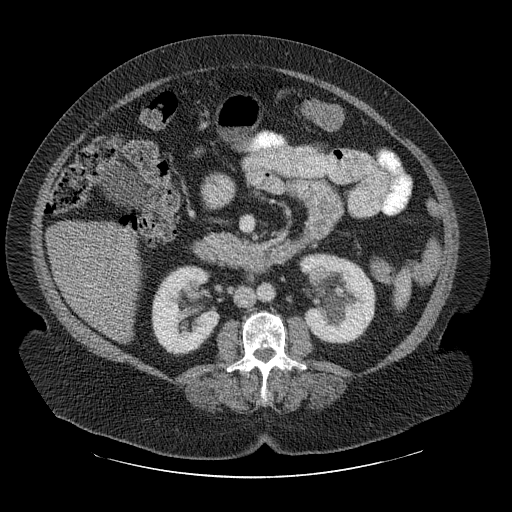
[im 59/96  soft-tissue]
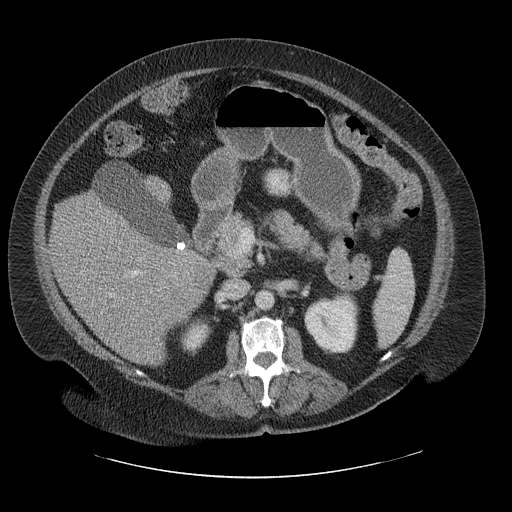
[im 59/96  bone]
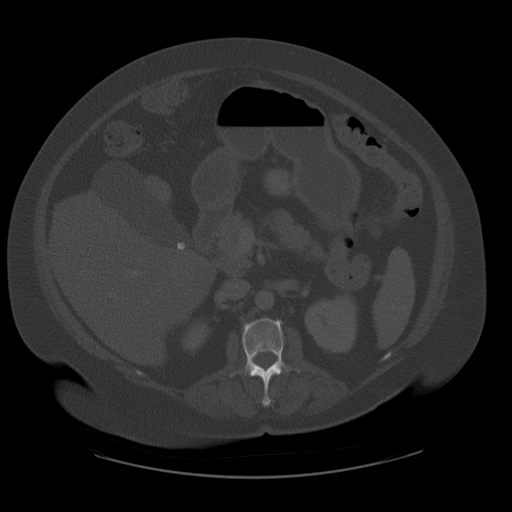
[im 64/96  soft-tissue]
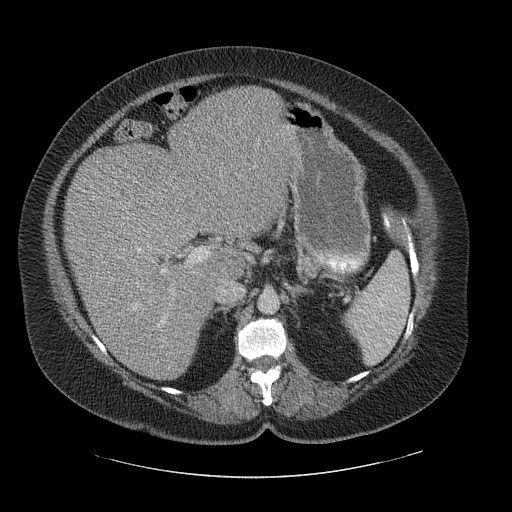
[im 69/96  soft-tissue]
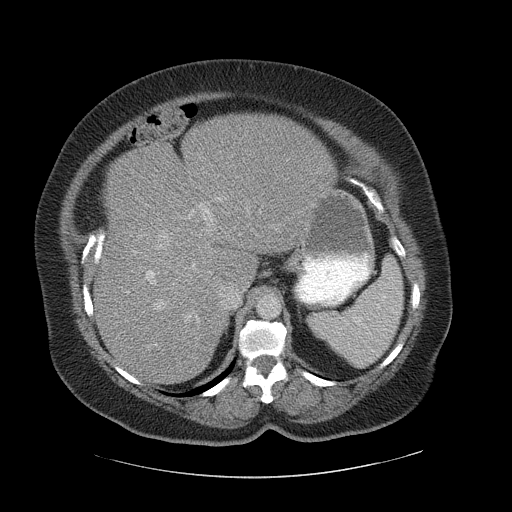
[im 74/96  soft-tissue]
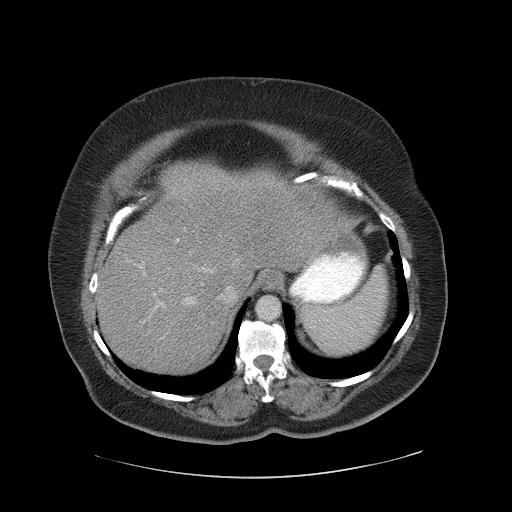
[im 85/96  soft-tissue]
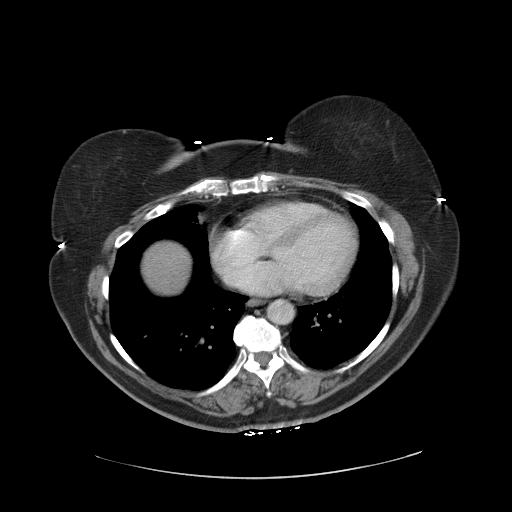
[im 90/96  soft-tissue]
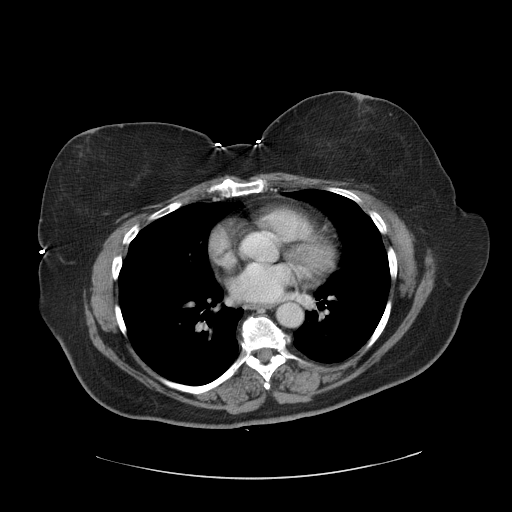

[Series 400: cor · coronal · 0.95mm/px · 3 of 185 slices shown]
[im 62/185  soft-tissue]
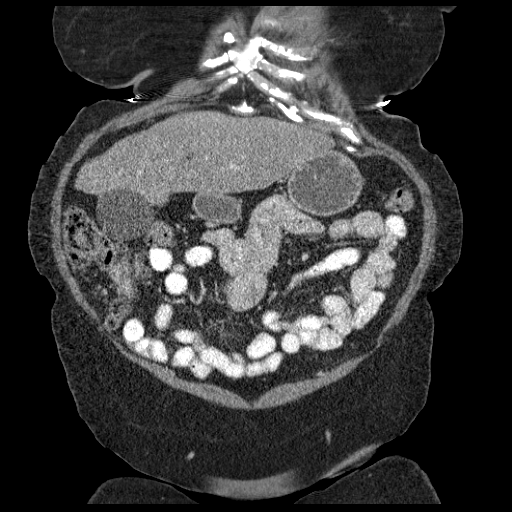
[im 82/185  soft-tissue]
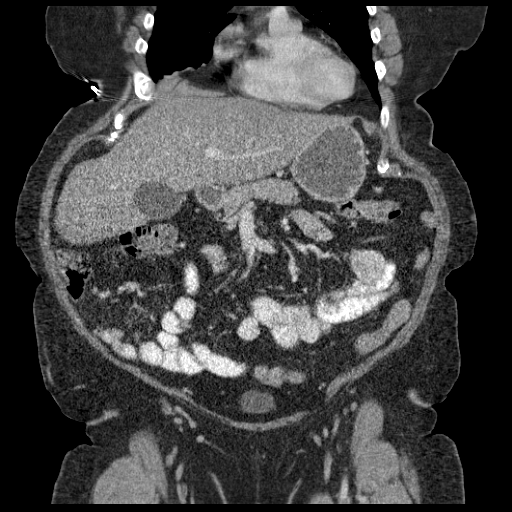
[im 103/185  soft-tissue]
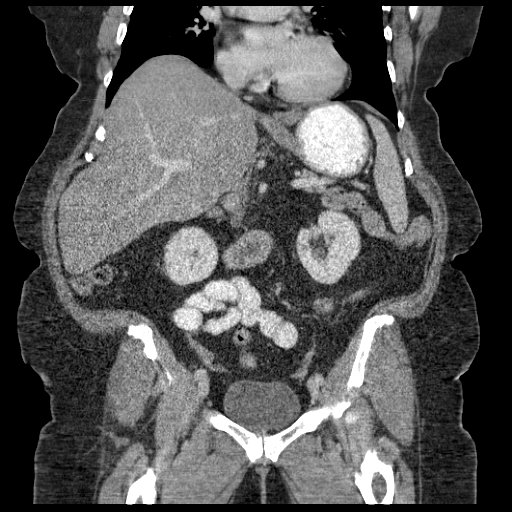

[17 of 46 positions shown; findings below may reference images not displayed]

FINDINGS: Triangular nodular density versus scarring 10 x 8 mm RIGHT lower
lobe image 1, incompletely visualized.

Fatty infiltration of liver.

Calcified gallstone dependently in gallbladder.

BILATERAL small renal cysts.

Liver, spleen, pancreas, kidneys, and adrenal glands otherwise
normal appearance.

Normal appendix.

Unremarkable ureters, bladder, uterus, and adnexae.

Stomach and bowel loops unremarkable.

No mass, adenopathy, free fluid or inflammatory process.

Bones demineralized with minimal retrolisthesis at L5-S1 and
multilevel lumbar disc space narrowing.
IMPRESSION: Fatty infiltration of liver.

Cholelithiasis.

Small BILATERAL renal cysts.

No acute intra-abdominal or intrapelvic abnormalities otherwise
identified.

Triangular nodular density in RIGHT lower lobe 10 x 8 mm,
incompletely imaged at cranial extent of CT abdomen exam ; followup
dedicated noncontrast CT chest recommended to fully evaluate and
exclude pulmonary mass.

## 2016-04-17 DIAGNOSIS — H43811 Vitreous degeneration, right eye: Secondary | ICD-10-CM | POA: Diagnosis not present

## 2016-04-23 DIAGNOSIS — M1711 Unilateral primary osteoarthritis, right knee: Secondary | ICD-10-CM | POA: Diagnosis not present

## 2016-04-23 DIAGNOSIS — I10 Essential (primary) hypertension: Secondary | ICD-10-CM | POA: Diagnosis not present

## 2016-04-23 DIAGNOSIS — M222X2 Patellofemoral disorders, left knee: Secondary | ICD-10-CM | POA: Diagnosis not present

## 2016-06-03 DIAGNOSIS — M1712 Unilateral primary osteoarthritis, left knee: Secondary | ICD-10-CM | POA: Diagnosis not present

## 2016-06-03 DIAGNOSIS — M1711 Unilateral primary osteoarthritis, right knee: Secondary | ICD-10-CM | POA: Diagnosis not present

## 2016-06-03 DIAGNOSIS — I10 Essential (primary) hypertension: Secondary | ICD-10-CM | POA: Diagnosis not present

## 2016-07-09 DIAGNOSIS — Z79890 Hormone replacement therapy (postmenopausal): Secondary | ICD-10-CM | POA: Diagnosis not present

## 2016-07-09 DIAGNOSIS — E1140 Type 2 diabetes mellitus with diabetic neuropathy, unspecified: Secondary | ICD-10-CM | POA: Diagnosis not present

## 2016-07-09 DIAGNOSIS — R809 Proteinuria, unspecified: Secondary | ICD-10-CM | POA: Diagnosis not present

## 2016-07-09 DIAGNOSIS — E1129 Type 2 diabetes mellitus with other diabetic kidney complication: Secondary | ICD-10-CM | POA: Diagnosis not present

## 2016-07-09 DIAGNOSIS — I10 Essential (primary) hypertension: Secondary | ICD-10-CM | POA: Diagnosis not present

## 2016-07-09 DIAGNOSIS — E039 Hypothyroidism, unspecified: Secondary | ICD-10-CM | POA: Diagnosis not present

## 2016-07-09 DIAGNOSIS — E1159 Type 2 diabetes mellitus with other circulatory complications: Secondary | ICD-10-CM | POA: Diagnosis not present

## 2016-07-30 DIAGNOSIS — L820 Inflamed seborrheic keratosis: Secondary | ICD-10-CM | POA: Diagnosis not present

## 2016-09-18 ENCOUNTER — Encounter: Payer: Self-pay | Admitting: Physician Assistant

## 2016-09-18 ENCOUNTER — Ambulatory Visit (INDEPENDENT_AMBULATORY_CARE_PROVIDER_SITE_OTHER): Payer: Medicare Other | Admitting: Physician Assistant

## 2016-09-18 VITALS — BP 126/70 | HR 88 | Temp 98.4°F | Resp 14 | Ht 63.0 in | Wt 228.0 lb

## 2016-09-18 DIAGNOSIS — M1711 Unilateral primary osteoarthritis, right knee: Secondary | ICD-10-CM | POA: Diagnosis not present

## 2016-09-18 MED ORDER — DICLOFENAC SODIUM 1 % TD GEL: 2.0000 g | Freq: Four times a day (QID) | TRANSDERMAL | 0 refills | Status: DC

## 2016-09-18 NOTE — Patient Instructions (Signed)
I have sent in a script for the Voltaren gel to use as directed. We are working on the referral for you to pain management. Their office should give you a call in the next week or so.

## 2016-09-18 NOTE — Progress Notes (Signed)
Pre visit review using our clinic review tool, if applicable. No additional management support is needed unless otherwise documented below in the visit note. 

## 2016-09-18 NOTE — Progress Notes (Signed)
Patient presents to clinic today for discussion of knee pain. This is patient's first visit to our office. She is a current patient of Dr Jonni Sanger who will be starting at our office in November. She will be keeping Dr. Jonni Sanger as her PCP.   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 Has had prior injections in 2014 and had been well overall since that time. Would only have occasional flares. Patient notes over the past 3 weeks, noting increased pain in R knee. Pain is described as deep and aching. Is a 7/10 on average when flared up.. Denies recent trauma or injury. Pain is worse with prolonged standing and ambulation. Denies weakness of leg or buckling of knee.  Patient has been taking Ibuprofen to help with pain with little relief. Previously on Diclofenac on PRN basis with good relief but has ran out of medication. Patient is requesting referral to [facility] for injections.   Past Medical History:  Diagnosis Date  . Allergy   . Arthritis   . Colon polyps   . Diabetes mellitus   . GERD (gastroesophageal reflux disease) 08/25/2012  . Hypertension   . Thyroid disease   . Uterine fibroid     Past Surgical History:  Procedure Laterality Date  . carpal tunnel  about 2005   right, Dr Fredna Dow  . thumb surgury  2003   dr Fredna Dow    Current Outpatient Prescriptions on File Prior to Visit  Medication Sig Dispense Refill  . aspirin 81 MG tablet Take 81 mg by mouth daily.    Marland Kitchen azelastine (ASTELIN) 0.1 % nasal spray instill 1 spray into each nostril twice a day 30 mL 12  . Cholecalciferol (VITAMIN D-3 PO) Take by mouth daily.    Marland Kitchen CINNAMON PO Take by mouth.     No current facility-administered medications on file prior to visit.     Allergies  Allergen Reactions  . Sulfa Antibiotics Rash    Family History  Problem Relation Age of Onset  . Arthritis Other   . Cancer Other        colon, ovarian, breast and prostate cancer  . Heart disease Other   .  Hypertension Other   . Diabetes Other     Social History   Social History  . Marital status: Divorced    Spouse name: N/A  . Number of children: N/A  . Years of education: 18   Occupational History  . OFFICE MEG Glass blower/designer   Social History Main Topics  . Smoking status: Former Research scientist (life sciences)  . Smokeless tobacco: Never Used  . Alcohol use Yes  . Drug use: No  . Sexual activity: Not on file   Other Topics Concern  . Not on file   Social History Narrative  . No narrative on file   Review of Systems  Constitutional: Negative for chills, fever and malaise/fatigue.  Eyes: Negative for blurred vision and double vision.  Musculoskeletal: Positive for joint pain. Negative for falls.  Neurological: Negative for dizziness and sensory change.   BP 126/70   Pulse 88   Temp 98.4 F (36.9 C) (Oral)   Resp 14   Ht 5\' 3"  (1.6 m)   Wt 228 lb (103.4 kg)   SpO2 98%   BMI 40.39 kg/m   Physical Exam  Constitutional: She is well-developed, well-nourished, and in no distress.  HENT:  Head: Normocephalic and atraumatic.  Eyes: Conjunctivae are normal.  Cardiovascular: Normal rate, regular rhythm, normal heart  sounds and intact distal pulses.   Pulmonary/Chest: Effort normal.  Musculoskeletal:       Right knee: She exhibits normal range of motion, no swelling, normal patellar mobility and normal meniscus. Tenderness found. Medial joint line tenderness noted. No lateral joint line, no MCL, no LCL and no patellar tendon tenderness noted.  Skin: Skin is warm and dry. No rash noted.  Psychiatric: Affect normal.  Vitals reviewed.  Assessment/Plan: 1. Primary osteoarthritis of right knee Referral to Pain Specialist for injections per patient request. Supportive measures and bracing discussed. Knee exam with mild medial knee pain on palpation. Strength and ROM preserved. Will start trial of Voltaren gel. Discussed follow-up. She will also schedule a routine follow-up with Dr. Jonni Sanger for  November when she will be starting at our clinic.  - Ambulatory referral to [place], Carry Ortez Cody, PA-C

## 2016-10-13 ENCOUNTER — Other Ambulatory Visit: Payer: Self-pay | Admitting: Physician Assistant

## 2016-10-13 NOTE — Telephone Encounter (Signed)
Patient has referral waiting on appointment. Please advise of refill

## 2016-10-15 DIAGNOSIS — Z23 Encounter for immunization: Secondary | ICD-10-CM | POA: Diagnosis not present

## 2016-10-23 DIAGNOSIS — Z803 Family history of malignant neoplasm of breast: Secondary | ICD-10-CM | POA: Diagnosis not present

## 2016-10-23 DIAGNOSIS — Z1231 Encounter for screening mammogram for malignant neoplasm of breast: Secondary | ICD-10-CM | POA: Diagnosis not present

## 2016-10-23 LAB — HM MAMMOGRAPHY

## 2016-11-06 ENCOUNTER — Ambulatory Visit (INDEPENDENT_AMBULATORY_CARE_PROVIDER_SITE_OTHER): Payer: Medicare Other | Admitting: Family Medicine

## 2016-11-06 ENCOUNTER — Encounter: Payer: Self-pay | Admitting: Family Medicine

## 2016-11-06 DIAGNOSIS — R809 Proteinuria, unspecified: Secondary | ICD-10-CM | POA: Diagnosis not present

## 2016-11-06 DIAGNOSIS — E1129 Type 2 diabetes mellitus with other diabetic kidney complication: Secondary | ICD-10-CM | POA: Diagnosis not present

## 2016-11-06 DIAGNOSIS — G8929 Other chronic pain: Secondary | ICD-10-CM

## 2016-11-06 DIAGNOSIS — M25561 Pain in right knee: Secondary | ICD-10-CM

## 2016-11-06 DIAGNOSIS — E1140 Type 2 diabetes mellitus with diabetic neuropathy, unspecified: Secondary | ICD-10-CM | POA: Diagnosis not present

## 2016-11-06 NOTE — Patient Instructions (Signed)
Your pain is due to arthritis. These are the different medications you can take for this: Tylenol 500mg  1-2 tabs three times a day for pain. Capsaicin, aspercreme, or biofreeze topically up to four times a day may also help with pain. Some supplements that may help for arthritis: Boswellia extract, curcumin, pycnogenol Aleve 1-2 tabs twice a day with food Cortisone injections are an option but these stopped working for you. We will get approval for orthovisc and start this series. It's important that you continue to stay active. Straight leg raises, knee extensions 3 sets of 10 once a day (add ankle weight if these become too easy). Consider physical therapy to strengthen muscles around the joint that hurts to take pressure off of the joint itself. Shoe inserts with good arch support may be helpful. Heat or ice 15 minutes at a time 3-4 times a day as needed to help with pain. Water aerobics and cycling with low resistance are the best two types of exercise for arthritis though any exercise is ok as long as it doesn't worsen the pain.

## 2016-11-09 DIAGNOSIS — M25561 Pain in right knee: Secondary | ICD-10-CM | POA: Insufficient documentation

## 2016-11-09 NOTE — Assessment & Plan Note (Signed)
independently reviewed remote standing radiographs from 2014 - mild arthritis noted then, right worse than left.  Reviewed prior notes from [place] office.  Advised repeating radiographs would not change management approach so deferred for now.  Discussed tylenol, topical medications, supplements that may help, aleve.  Will get approval for orthovisc series - she's done well with viscosupplementation in past and cortisone not helping.  Last time had series was in November 2014.  Shown home exercises to do daily.  Heat/ice.  Shoe inserts.  Will call when approved to start series.

## 2016-11-09 NOTE — Progress Notes (Signed)
PCP: Leamon Arnt, MD  Subjective:   HPI: Patient is a 71 y.o. female here for right knee pain.  Patient reports she's had longstanding history of right knee pain and arthritis. Dates back to at least 2012. Pain is up to 6-7/10 now, sharp, anterior. Worse with walking. Pain also worse with stairs. No benefit with cortisone injections but supartz worked in the past for several months to years. Tried oral nsaid, diclofenac gel (gave her a rash). Used to use arnicare and topical hyaluronic acid. No skin changes, numbness.  Past Medical History:  Diagnosis Date  . Allergy   . Arthritis   . Colon polyps   . Diabetes mellitus   . GERD (gastroesophageal reflux disease) 08/25/2012  . Hypertension   . Thyroid disease   . Uterine fibroid     Current Outpatient Prescriptions on File Prior to Visit  Medication Sig Dispense Refill  . aspirin 81 MG tablet Take 81 mg by mouth daily.    Marland Kitchen azelastine (ASTELIN) 0.1 % nasal spray instill 1 spray into each nostril twice a day 30 mL 12  . Cholecalciferol (VITAMIN D-3 PO) Take by mouth daily.    Marland Kitchen CINNAMON PO Take by mouth.    . diclofenac (VOLTAREN) 75 MG EC tablet Take 75 mg by mouth 2 (two) times daily.    . diclofenac sodium (VOLTAREN) 1 % GEL apply 2 grams to affected area four times a day 100 g 0  . Estradiol 0.5 MG/0.5GM GEL Place onto the skin.    . fluticasone (FLONASE) 50 MCG/ACT nasal spray Place 1 spray into both nostrils daily.    Marland Kitchen glipiZIDE (GLUCOTROL XL) 5 MG 24 hr tablet Take 5 mg by mouth daily with breakfast.    . levothyroxine (SYNTHROID, LEVOTHROID) 150 MCG tablet Take 150 mcg by mouth daily before breakfast.    . liraglutide (VICTOZA) 18 MG/3ML SOPN Inject 1.8 mg into the skin daily.    Marland Kitchen lisinopril (PRINIVIL,ZESTRIL) 10 MG tablet Take 10 mg by mouth daily.    . metFORMIN (GLUCOPHAGE) 1000 MG tablet Take 1,000 mg by mouth 2 (two) times daily with a meal.    . pioglitazone (ACTOS) 15 MG tablet Take 15 mg by mouth daily.     . Triprolidine-Pseudoephedrine (ACTIFED PO) Take 1 tablet by mouth daily.     No current facility-administered medications on file prior to visit.     Past Surgical History:  Procedure Laterality Date  . carpal tunnel  about 2005   right, Dr Fredna Dow  . thumb surgury  2003   dr Fredna Dow    Allergies  Allergen Reactions  . Sulfa Antibiotics Rash    Social History   Social History  . Marital status: Divorced    Spouse name: N/A  . Number of children: N/A  . Years of education: 69   Occupational History  . OFFICE MEG Glass blower/designer   Social History Main Topics  . Smoking status: Former Research scientist (life sciences)  . Smokeless tobacco: Never Used  . Alcohol use Yes  . Drug use: No  . Sexual activity: Not on file   Other Topics Concern  . Not on file   Social History Narrative  . No narrative on file    Family History  Problem Relation Age of Onset  . Arthritis Other   . Cancer Other        colon, ovarian, breast and prostate cancer  . Heart disease Other   . Hypertension Other   . Diabetes Other  BP (!) 159/74   Pulse 83   Ht 5\' 3"  (1.6 m)   Wt 220 lb (99.8 kg)   BMI 38.97 kg/m   Review of Systems: See HPI above.     Objective:  Physical Exam:  Gen: NAD, comfortable in exam room  Right knee: No gross deformity, ecchymoses, swelling. Mild TTP medial joint line. FROM with 5/5 strength flexion and extension. Negative ant/post drawers. Negative valgus/varus testing. Negative lachmanns. Negative mcmurrays, apleys, patellar apprehension. NV intact distally.  Left knee: No gross deformity, ecchymoses, swelling. No TTP. FROM with 5/5 strength flexion and extension. Negative ant/post drawers. Negative valgus/varus testing. Negative lachmanns. Negative mcmurrays, apleys, patellar apprehension. NV intact distally.   Assessment & Plan:  1. Right knee pain - independently reviewed remote standing radiographs from 2014 - mild arthritis noted then, right worse than left.   Reviewed prior notes from [place] office.  Advised repeating radiographs would not change management approach so deferred for now.  Discussed tylenol, topical medications, supplements that may help, aleve.  Will get approval for orthovisc series - she's done well with viscosupplementation in past and cortisone not helping.  Last time had series was in November 2014.  Shown home exercises to do daily.  Heat/ice.  Shoe inserts.  Will call when approved to start series.

## 2016-11-12 DIAGNOSIS — E1140 Type 2 diabetes mellitus with diabetic neuropathy, unspecified: Secondary | ICD-10-CM | POA: Diagnosis not present

## 2016-11-12 DIAGNOSIS — Z79890 Hormone replacement therapy (postmenopausal): Secondary | ICD-10-CM | POA: Diagnosis not present

## 2016-11-12 DIAGNOSIS — E1165 Type 2 diabetes mellitus with hyperglycemia: Secondary | ICD-10-CM | POA: Diagnosis not present

## 2016-11-12 DIAGNOSIS — E1159 Type 2 diabetes mellitus with other circulatory complications: Secondary | ICD-10-CM | POA: Diagnosis not present

## 2016-11-12 DIAGNOSIS — E1129 Type 2 diabetes mellitus with other diabetic kidney complication: Secondary | ICD-10-CM | POA: Diagnosis not present

## 2016-11-12 DIAGNOSIS — E039 Hypothyroidism, unspecified: Secondary | ICD-10-CM | POA: Diagnosis not present

## 2016-11-12 DIAGNOSIS — I10 Essential (primary) hypertension: Secondary | ICD-10-CM | POA: Diagnosis not present

## 2016-11-12 DIAGNOSIS — R809 Proteinuria, unspecified: Secondary | ICD-10-CM | POA: Diagnosis not present

## 2016-11-19 ENCOUNTER — Encounter: Payer: Self-pay | Admitting: Family Medicine

## 2016-11-19 ENCOUNTER — Ambulatory Visit (INDEPENDENT_AMBULATORY_CARE_PROVIDER_SITE_OTHER): Payer: Medicare Other | Admitting: Family Medicine

## 2016-11-19 DIAGNOSIS — M1711 Unilateral primary osteoarthritis, right knee: Secondary | ICD-10-CM

## 2016-11-20 DIAGNOSIS — M1711 Unilateral primary osteoarthritis, right knee: Secondary | ICD-10-CM | POA: Insufficient documentation

## 2016-11-20 NOTE — Progress Notes (Signed)
PCP: Leamon Arnt, MD  Subjective:   HPI: Patient is a 71 y.o. female here for right knee pain.  10/12: Patient reports she's had longstanding history of right knee pain and arthritis. Dates back to at least 2012. Pain is up to 6-7/10 now, sharp, anterior. Worse with walking. Pain also worse with stairs. No benefit with cortisone injections but supartz worked in the past for several months to years. Tried oral nsaid, diclofenac gel (gave her a rash). Used to use arnicare and topical hyaluronic acid. No skin changes, numbness.  10/25: Patient returns to start orthovisc series. Pain up to 8/10 now, more painful this week. No skin changes, numbness.  Past Medical History:  Diagnosis Date  . Allergy   . Arthritis   . Colon polyps   . Diabetes mellitus   . GERD (gastroesophageal reflux disease) 08/25/2012  . Hypertension   . Thyroid disease   . Uterine fibroid     Current Outpatient Prescriptions on File Prior to Visit  Medication Sig Dispense Refill  . aspirin 81 MG tablet Take 81 mg by mouth daily.    Marland Kitchen azelastine (ASTELIN) 0.1 % nasal spray instill 1 spray into each nostril twice a day 30 mL 12  . Cholecalciferol (VITAMIN D-3 PO) Take by mouth daily.    Marland Kitchen CINNAMON PO Take by mouth.    . diclofenac (VOLTAREN) 75 MG EC tablet Take 75 mg by mouth 2 (two) times daily.    . Estradiol 0.5 MG/0.5GM GEL Place onto the skin.    . fluticasone (FLONASE) 50 MCG/ACT nasal spray Place 1 spray into both nostrils daily.    Marland Kitchen glipiZIDE (GLUCOTROL XL) 5 MG 24 hr tablet Take 5 mg by mouth daily with breakfast.    . levothyroxine (SYNTHROID, LEVOTHROID) 150 MCG tablet Take 150 mcg by mouth daily before breakfast.    . liraglutide (VICTOZA) 18 MG/3ML SOPN Inject 1.8 mg into the skin daily.    Marland Kitchen lisinopril (PRINIVIL,ZESTRIL) 10 MG tablet Take 10 mg by mouth daily.    . metFORMIN (GLUCOPHAGE) 1000 MG tablet Take 1,000 mg by mouth 2 (two) times daily with a meal.    . pioglitazone (ACTOS) 15  MG tablet Take 15 mg by mouth daily.    . Triprolidine-Pseudoephedrine (ACTIFED PO) Take 1 tablet by mouth daily.     No current facility-administered medications on file prior to visit.     Past Surgical History:  Procedure Laterality Date  . carpal tunnel  about 2005   right, Dr Fredna Dow  . thumb surgury  2003   dr Fredna Dow    Allergies  Allergen Reactions  . Voltaren  [Diclofenac Sodium] Rash    Only the topical   . Sulfa Antibiotics Rash    Social History   Social History  . Marital status: Divorced    Spouse name: N/A  . Number of children: N/A  . Years of education: 7   Occupational History  . OFFICE MEG Glass blower/designer   Social History Main Topics  . Smoking status: Former Research scientist (life sciences)  . Smokeless tobacco: Never Used  . Alcohol use Yes  . Drug use: No  . Sexual activity: Not on file   Other Topics Concern  . Not on file   Social History Narrative  . No narrative on file    Family History  Problem Relation Age of Onset  . Arthritis Other   . Cancer Other        colon, ovarian, breast and prostate cancer  .  Heart disease Other   . Hypertension Other   . Diabetes Other     BP (!) 146/82   Pulse 86   Ht 5\' 3"  (1.6 m)   Wt 220 lb (99.8 kg)   BMI 38.97 kg/m   Review of Systems: See HPI above.     Objective:  Physical Exam:  Exam not repeated today. Gen: NAD, comfortable in exam room  Right knee: No gross deformity, ecchymoses, swelling. Mild TTP medial joint line. FROM with 5/5 strength flexion and extension. Negative ant/post drawers. Negative valgus/varus testing. Negative lachmanns. Negative mcmurrays, apleys, patellar apprehension. NV intact distally.  Left knee: No gross deformity, ecchymoses, swelling. No TTP. FROM with 5/5 strength flexion and extension. Negative ant/post drawers. Negative valgus/varus testing. Negative lachmanns. Negative mcmurrays, apleys, patellar apprehension. NV intact distally.   Assessment & Plan:  1. Right  knee pain - 2/2 DJD.  Discussed tylenol, topical medications, supplements that may help, aleve.  First orthovisc injection given today.  F/u in ~1 week for second injection.  After informed written consent timeout was performed, patient was seated on exam table. Right knee was prepped with alcohol swab and utilizing anteromedial approach, patient's right knee was injected intraarticularly with 24mL bupivicaine followed by orthovisc. Patient tolerated the procedure well without immediate complications.

## 2016-11-20 NOTE — Assessment & Plan Note (Signed)
Discussed tylenol, topical medications, supplements that may help, aleve.  First orthovisc injection given today.  F/u in ~1 week for second injection.  After informed written consent timeout was performed, patient was seated on exam table. Right knee was prepped with alcohol swab and utilizing anteromedial approach, patient's right knee was injected intraarticularly with 68mL bupivicaine followed by orthovisc. Patient tolerated the procedure well without immediate complications.

## 2016-12-02 ENCOUNTER — Encounter: Payer: Self-pay | Admitting: Physician Assistant

## 2016-12-02 ENCOUNTER — Ambulatory Visit (INDEPENDENT_AMBULATORY_CARE_PROVIDER_SITE_OTHER): Payer: Medicare Other | Admitting: Physician Assistant

## 2016-12-02 ENCOUNTER — Other Ambulatory Visit: Payer: Self-pay

## 2016-12-02 VITALS — BP 122/76 | HR 79 | Temp 98.4°F | Resp 14 | Ht 63.0 in | Wt 224.0 lb

## 2016-12-02 DIAGNOSIS — J0110 Acute frontal sinusitis, unspecified: Secondary | ICD-10-CM | POA: Diagnosis not present

## 2016-12-02 MED ORDER — BENZONATATE 100 MG PO CAPS: 100.0000 mg | ORAL_CAPSULE | Freq: Two times a day (BID) | ORAL | 0 refills | Status: DC | PRN

## 2016-12-02 MED ORDER — AZITHROMYCIN 250 MG PO TABS: ORAL_TABLET | ORAL | 0 refills | Status: DC

## 2016-12-02 NOTE — Patient Instructions (Signed)
Please take antibiotic as directed.  Increase fluid intake.  Use Saline nasal spray.  Take a daily multivitamin. Start Tessalon for cough and plain Mucinex to help this congestion.  Place a humidifier in the bedroom.  Please call or return clinic if symptoms are not improving.  Sinusitis Sinusitis is redness, soreness, and swelling (inflammation) of the paranasal sinuses. Paranasal sinuses are air pockets within the bones of your face (beneath the eyes, the middle of the forehead, or above the eyes). In healthy paranasal sinuses, mucus is able to drain out, and air is able to circulate through them by way of your nose. However, when your paranasal sinuses are inflamed, mucus and air can become trapped. This can allow bacteria and other germs to grow and cause infection. Sinusitis can develop quickly and last only a short time (acute) or continue over a long period (chronic). Sinusitis that lasts for more than 12 weeks is considered chronic.  CAUSES  Causes of sinusitis include:  Allergies.  Structural abnormalities, such as displacement of the cartilage that separates your nostrils (deviated septum), which can decrease the air flow through your nose and sinuses and affect sinus drainage.  Functional abnormalities, such as when the small hairs (cilia) that line your sinuses and help remove mucus do not work properly or are not present. SYMPTOMS  Symptoms of acute and chronic sinusitis are the same. The primary symptoms are pain and pressure around the affected sinuses. Other symptoms include:  Upper toothache.  Earache.  Headache.  Bad breath.  Decreased sense of smell and taste.  A cough, which worsens when you are lying flat.  Fatigue.  Fever.  Thick drainage from your nose, which often is green and may contain pus (purulent).  Swelling and warmth over the affected sinuses. DIAGNOSIS  Your caregiver will perform a physical exam. During the exam, your caregiver may:  Look in  your nose for signs of abnormal growths in your nostrils (nasal polyps).  Tap over the affected sinus to check for signs of infection.  View the inside of your sinuses (endoscopy) with a special imaging device with a light attached (endoscope), which is inserted into your sinuses. If your caregiver suspects that you have chronic sinusitis, one or more of the following tests may be recommended:  Allergy tests.  Nasal culture A sample of mucus is taken from your nose and sent to a lab and screened for bacteria.  Nasal cytology A sample of mucus is taken from your nose and examined by your caregiver to determine if your sinusitis is related to an allergy. TREATMENT  Most cases of acute sinusitis are related to a viral infection and will resolve on their own within 10 days. Sometimes medicines are prescribed to help relieve symptoms (pain medicine, decongestants, nasal steroid sprays, or saline sprays).  However, for sinusitis related to a bacterial infection, your caregiver will prescribe antibiotic medicines. These are medicines that will help kill the bacteria causing the infection.  Rarely, sinusitis is caused by a fungal infection. In theses cases, your caregiver will prescribe antifungal medicine. For some cases of chronic sinusitis, surgery is needed. Generally, these are cases in which sinusitis recurs more than 3 times per year, despite other treatments. HOME CARE INSTRUCTIONS   Drink plenty of water. Water helps thin the mucus so your sinuses can drain more easily.  Use a humidifier.  Inhale steam 3 to 4 times a day (for example, sit in the bathroom with the shower running).  Apply a warm,  moist washcloth to your face 3 to 4 times a day, or as directed by your caregiver.  Use saline nasal sprays to help moisten and clean your sinuses.  Take over-the-counter or prescription medicines for pain, discomfort, or fever only as directed by your caregiver. SEEK IMMEDIATE MEDICAL CARE  IF:  You have increasing pain or severe headaches.  You have nausea, vomiting, or drowsiness.  You have swelling around your face.  You have vision problems.  You have a stiff neck.  You have difficulty breathing. MAKE SURE YOU:   Understand these instructions.  Will watch your condition.  Will get help right away if you are not doing well or get worse. Document Released: 01/12/2005 Document Revised: 04/06/2011 Document Reviewed: 01/27/2011 [facility] Patient Information 2014 [place], [place].

## 2016-12-02 NOTE — Progress Notes (Signed)
Pre visit review using our clinic review tool, if applicable. No additional management support is needed unless otherwise documented below in the visit note. 

## 2016-12-02 NOTE — Progress Notes (Signed)
Patient presents to clinic today c/o 5 days of chest congestion with sore throat, PND and cough that is now productive of colored sputum. Endorses prior history of bronchitis. Has noted some increased windedness with exertion since this started. Denies wheezing. Has been using her Flonase as directed, along with saline nasal rinse.   Past Medical History:  Diagnosis Date  . Allergy   . Arthritis   . Colon polyps   . Diabetes mellitus   . GERD (gastroesophageal reflux disease) 08/25/2012  . Hypertension   . Thyroid disease   . Uterine fibroid     Current Outpatient Medications on File Prior to Visit  Medication Sig Dispense Refill  . aspirin 81 MG tablet Take 81 mg by mouth daily.    . fluticasone (FLONASE) 50 MCG/ACT nasal spray Place 1 spray into both nostrils daily.    Marland Kitchen glipiZIDE (GLUCOTROL XL) 5 MG 24 hr tablet Take 5 mg by mouth daily with breakfast.    . levothyroxine (SYNTHROID, LEVOTHROID) 150 MCG tablet Take 150 mcg by mouth daily before breakfast.    . liraglutide (VICTOZA) 18 MG/3ML SOPN Inject 1.8 mg into the skin daily.    Marland Kitchen lisinopril (PRINIVIL,ZESTRIL) 10 MG tablet Take 10 mg by mouth daily.    . metFORMIN (GLUCOPHAGE) 1000 MG tablet Take 1,000 mg by mouth 2 (two) times daily with a meal.    . pioglitazone (ACTOS) 15 MG tablet Take 15 mg by mouth daily.    . Triprolidine-Pseudoephedrine (ACTIFED PO) Take 1 tablet by mouth daily.    Marland Kitchen azelastine (ASTELIN) 0.1 % nasal spray instill 1 spray into each nostril twice a day (Patient not taking: Reported on 12/02/2016) 30 mL 12  . Cholecalciferol (VITAMIN D-3 PO) Take by mouth daily.    Marland Kitchen CINNAMON PO Take by mouth.    . Estradiol 0.5 MG/0.5GM GEL Place onto the skin.     No current facility-administered medications on file prior to visit.     Allergies  Allergen Reactions  . Voltaren  [Diclofenac Sodium] Rash    Only the topical   . Sulfa Antibiotics Rash    Family History  Problem Relation Age of Onset  . Arthritis  Other   . Cancer Other        colon, ovarian, breast and prostate cancer  . Heart disease Other   . Hypertension Other   . Diabetes Other     Social History   Socioeconomic History  . Marital status: Divorced    Spouse name: None  . Number of children: None  . Years of education: 71  . Highest education level: None  Social Needs  . Financial resource strain: None  . Food insecurity - worry: None  . Food insecurity - inability: None  . Transportation needs - medical: None  . Transportation needs - non-medical: None  Occupational History  . 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 Smoking status: Former Research scientist (life sciences)  . Smokeless tobacco: Never Used  Substance and Sexual Activity  . Alcohol use: Yes  . Drug use: No  . Sexual activity: None  Other Topics Concern  . None  Social History Narrative  . None   Review of Systems - See HPI.  All other ROS are negative.  BP 122/76   Pulse 79   Temp 98.4 F (36.9 C) (Oral)   Resp 14   Ht 5\' 3"  (1.6 m)   Wt 224 lb (101.6 kg)  SpO2 98%   BMI 39.68 kg/m   Physical Exam  Constitutional: She is oriented to person, place, and time and well-developed, well-nourished, and in no distress.  HENT:  Head: Normocephalic and atraumatic.  Right Ear: Tympanic membrane normal.  Left Ear: Tympanic membrane normal.  Nose: Mucosal edema and rhinorrhea present. Right sinus exhibits maxillary sinus tenderness. Left sinus exhibits maxillary sinus tenderness.  Mouth/Throat: Uvula is midline, oropharynx is clear and moist and mucous membranes are normal.  Eyes: Conjunctivae are normal.  Neck: Neck supple.  Cardiovascular: Normal rate, regular rhythm, normal heart sounds and intact distal pulses.  Pulmonary/Chest: Effort normal and breath sounds normal.  Lymphadenopathy:    She has no cervical adenopathy.  Neurological: She is alert and oriented to person, place, and time.  Skin: Skin is warm and dry. No rash noted.    Psychiatric: Affect normal.  Vitals reviewed.  Assessment/Plan: 1. Acute frontal sinusitis, recurrence not specified Rx Azithromycin.  Increase fluids.  Rest.  Saline nasal spray.  Probiotic.  Mucinex as directed.  Humidifier in bedroom. Tessalon per orders.  Call or return to clinic if symptoms are not improving.  - azithromycin (ZITHROMAX) 250 MG tablet; Take 2 tablets on Day 1. Then take 1 tablet daily.  Dispense: 6 tablet; Refill: 0 - benzonatate (TESSALON) 100 MG capsule; Take 1 capsule (100 mg total) 2 (two) times daily as needed by mouth for cough.  Dispense: 20 capsule; Refill: 0   Leeanne Rio, [place]

## 2016-12-03 ENCOUNTER — Encounter: Payer: Self-pay | Admitting: Family Medicine

## 2016-12-03 ENCOUNTER — Ambulatory Visit (INDEPENDENT_AMBULATORY_CARE_PROVIDER_SITE_OTHER): Payer: Medicare Other | Admitting: Family Medicine

## 2016-12-03 DIAGNOSIS — M1711 Unilateral primary osteoarthritis, right knee: Secondary | ICD-10-CM | POA: Diagnosis present

## 2016-12-03 NOTE — Progress Notes (Signed)
PCP: Leamon Arnt, MD  Subjective:   HPI: Cynthia Barnett is a 71 y.o. female here for right knee pain.  10/12: Cynthia Barnett reports she's had longstanding history of right knee pain and arthritis. Dates back to at least 2012. Pain is up to 6-7/10 now, sharp, anterior. Worse with walking. Pain also worse with stairs. No benefit with cortisone injections but supartz worked in the past for several months to years. Tried oral nsaid, diclofenac gel (gave her a rash). Used to use arnicare and topical hyaluronic acid. No skin changes, numbness.  10/25: Cynthia Barnett returns to start orthovisc series. Pain up to 8/10 now, more painful this week. No skin changes, numbness.  11/8: Cynthia Barnett is doing well - feels about 60-75% improvement following last shot. She has had some bruising at shot location. No numbness, other complaints.  Past Medical History:  Diagnosis Date  . Allergy   . Arthritis   . Colon polyps   . Diabetes mellitus   . GERD (gastroesophageal reflux disease) 08/25/2012  . Hypertension   . Thyroid disease   . Uterine fibroid     Current Outpatient Medications on File Prior to Visit  Medication Sig Dispense Refill  . aspirin 81 MG tablet Take 81 mg by mouth daily.    Marland Kitchen azelastine (ASTELIN) 0.1 % nasal spray instill 1 spray into each nostril twice a day (Cynthia Barnett not taking: Reported on 12/02/2016) 30 mL 12  . azithromycin (ZITHROMAX) 250 MG tablet Take 2 tablets on Day 1. Then take 1 tablet daily. 6 tablet 0  . benzonatate (TESSALON) 100 MG capsule Take 1 capsule (100 mg total) 2 (two) times daily as needed by mouth for cough. 20 capsule 0  . Cholecalciferol (VITAMIN D-3 PO) Take by mouth daily.    Marland Kitchen CINNAMON PO Take by mouth.    . Estradiol 0.5 MG/0.5GM GEL Place onto the skin.    . fluticasone (FLONASE) 50 MCG/ACT nasal spray Place 1 spray into both nostrils daily.    Marland Kitchen glipiZIDE (GLUCOTROL XL) 5 MG 24 hr tablet Take 5 mg by mouth daily with breakfast.    . levothyroxine  (SYNTHROID, LEVOTHROID) 150 MCG tablet Take 150 mcg by mouth daily before breakfast.    . liraglutide (VICTOZA) 18 MG/3ML SOPN Inject 1.8 mg into the skin daily.    Marland Kitchen lisinopril (PRINIVIL,ZESTRIL) 10 MG tablet Take 10 mg by mouth daily.    . metFORMIN (GLUCOPHAGE) 1000 MG tablet Take 1,000 mg by mouth 2 (two) times daily with a meal.    . pioglitazone (ACTOS) 15 MG tablet Take 15 mg by mouth daily.    . Triprolidine-Pseudoephedrine (ACTIFED PO) Take 1 tablet by mouth daily.     No current facility-administered medications on file prior to visit.     Past Surgical History:  Procedure Laterality Date  . carpal tunnel  about 2005   right, Dr Fredna Dow  . thumb surgury  2003   dr Fredna Dow    Allergies  Allergen Reactions  . Voltaren  [Diclofenac Sodium] Rash    Only the topical   . Sulfa Antibiotics Rash    Social History   Socioeconomic History  . Marital status: Divorced    Spouse name: Not on file  . Number of children: Not on file  . Years of education: 20  . Highest education level: Not on file  Social Needs  . Financial resource strain: Not on file  . Food insecurity - worry: Not on file  . Food insecurity - inability: Not  on file  . Transportation needs - medical: Not on file  . Transportation needs - non-medical: Not on file  Occupational History  . 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 Smoking status: Former Research scientist (life sciences)  . Smokeless tobacco: Never Used  Substance and Sexual Activity  . Alcohol use: Yes  . Drug use: No  . Sexual activity: Not on file  Other Topics Concern  . Not on file  Social History Narrative  . Not on file    Family History  Problem Relation Age of Onset  . Arthritis Other   . Cancer Other        colon, ovarian, breast and prostate cancer  . Heart disease Other   . Hypertension Other   . Diabetes Other     BP (!) 166/89   Pulse 82   Ht 5\' 3"  (1.6 m)   Wt 220 lb (99.8 kg)   BMI 38.97 kg/m   Review of  Systems: See HPI above.     Objective:  Physical Exam:  Exam not repeated today. Gen: NAD, comfortable in exam room  Right knee: No gross deformity, ecchymoses, swelling. Mild TTP medial joint line. FROM with 5/5 strength flexion and extension. Negative ant/post drawers. Negative valgus/varus testing. Negative lachmanns. Negative mcmurrays, apleys, patellar apprehension. NV intact distally.  Left knee: No gross deformity, ecchymoses, swelling. No TTP. FROM with 5/5 strength flexion and extension. Negative ant/post drawers. Negative valgus/varus testing. Negative lachmanns. Negative mcmurrays, apleys, patellar apprehension. NV intact distally.   Assessment & Plan:  1. 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  Discussed tylenol, topical medications, supplements that may help, aleve.  Second orthovisc injection given today.  F/u in ~1 week for third injection.  After informed written consent timeout was performed, Cynthia Barnett was seated on exam table. Right knee was prepped with alcohol swab and utilizing anteromedial approach, Cynthia Barnett's right knee was injected intraarticularly with 2mL bupivicaine followed by orthovisc. Cynthia Barnett tolerated the procedure well without immediate complications.

## 2016-12-03 NOTE — Assessment & Plan Note (Signed)
Discussed tylenol, topical medications, supplements that may help, aleve.  Second orthovisc injection given today.  F/u in ~1 week for third injection.  After informed written consent timeout was performed, patient was seated on exam table. Right knee was prepped with alcohol swab and utilizing anteromedial approach, patient's right knee was injected intraarticularly with 43mL bupivicaine followed by orthovisc. Patient tolerated the procedure well without immediate complications.

## 2016-12-10 ENCOUNTER — Ambulatory Visit (INDEPENDENT_AMBULATORY_CARE_PROVIDER_SITE_OTHER): Payer: Medicare Other | Admitting: Family Medicine

## 2016-12-10 ENCOUNTER — Encounter: Payer: Self-pay | Admitting: Family Medicine

## 2016-12-10 DIAGNOSIS — M1711 Unilateral primary osteoarthritis, right knee: Secondary | ICD-10-CM

## 2016-12-10 NOTE — Assessment & Plan Note (Signed)
2/2 DJD.  Discussed tylenol, topical medications, supplements that may help, aleve.  Third orthovisc injection given today.  Will hold the fourth injection in case she needs this later.  After informed written consent timeout was performed, patient was seated on exam table. Right knee was prepped with alcohol swab and utilizing anteromedial approach, patient's right knee was injected intraarticularly with 42mL bupivicaine followed by orthovisc. Patient tolerated the procedure well without immediate complications.

## 2016-12-10 NOTE — Progress Notes (Signed)
PCP: Leamon Arnt, MD  Subjective:   HPI: Patient is a 71 y.o. female here for right knee pain.  10/12: Patient reports she's had longstanding history of right knee pain and arthritis. Dates back to at least 2012. Pain is up to 6-7/10 now, sharp, anterior. Worse with walking. Pain also worse with stairs. No benefit with cortisone injections but supartz worked in the past for several months to years. Tried oral nsaid, diclofenac gel (gave her a rash). Used to use arnicare and topical hyaluronic acid. No skin changes, numbness.  10/25: Patient returns to start orthovisc series. Pain up to 8/10 now, more painful this week. No skin changes, numbness.  11/8: Patient is doing well - feels about 60-75% improvement following last shot. She has had some bruising at shot location. No numbness, other complaints.  11/15: Patient reports she's doing well. Pain level 0/10. No skin changes, numbness.   Past Medical History:  Diagnosis Date  . Allergy   . Arthritis   . Colon polyps   . Diabetes mellitus   . GERD (gastroesophageal reflux disease) 08/25/2012  . Hypertension   . Thyroid disease   . Uterine fibroid     Current Outpatient Medications on File Prior to Visit  Medication Sig Dispense Refill  . aspirin 81 MG tablet Take 81 mg by mouth daily.    Marland Kitchen azelastine (ASTELIN) 0.1 % nasal spray instill 1 spray into each nostril twice a day (Patient not taking: Reported on 12/02/2016) 30 mL 12  . azithromycin (ZITHROMAX) 250 MG tablet Take 2 tablets on Day 1. Then take 1 tablet daily. 6 tablet 0  . benzonatate (TESSALON) 100 MG capsule Take 1 capsule (100 mg total) 2 (two) times daily as needed by mouth for cough. 20 capsule 0  . Cholecalciferol (VITAMIN D-3 PO) Take by mouth daily.    Marland Kitchen CINNAMON PO Take by mouth.    . Estradiol 0.5 MG/0.5GM GEL Place onto the skin.    . fluticasone (FLONASE) 50 MCG/ACT nasal spray Place 1 spray into both nostrils daily.    Marland Kitchen glipiZIDE  (GLUCOTROL XL) 5 MG 24 hr tablet Take 5 mg by mouth daily with breakfast.    . levothyroxine (SYNTHROID, LEVOTHROID) 150 MCG tablet Take 150 mcg by mouth daily before breakfast.    . liraglutide (VICTOZA) 18 MG/3ML SOPN Inject 1.8 mg into the skin daily.    Marland Kitchen lisinopril (PRINIVIL,ZESTRIL) 10 MG tablet Take 10 mg by mouth daily.    . metFORMIN (GLUCOPHAGE) 1000 MG tablet Take 1,000 mg by mouth 2 (two) times daily with a meal.    . pioglitazone (ACTOS) 15 MG tablet Take 15 mg by mouth daily.    . Triprolidine-Pseudoephedrine (ACTIFED PO) Take 1 tablet by mouth daily.     No current facility-administered medications on file prior to visit.     Past Surgical History:  Procedure Laterality Date  . carpal tunnel  about 2005   right, Dr Fredna Dow  . thumb surgury  2003   dr Fredna Dow    Allergies  Allergen Reactions  . Voltaren  [Diclofenac Sodium] Rash    Only the topical   . Sulfa Antibiotics Rash    Social History   Socioeconomic History  . Marital status: Divorced    Spouse name: Not on file  . Number of children: Not on file  . Years of education: 64  . Highest education level: Not on file  Social Needs  . Financial resource strain: Not on file  .  Food insecurity - worry: Not on file  . Food insecurity - inability: Not on file  . Transportation needs - medical: Not on file  . Transportation needs - non-medical: Not on file  Occupational History  . 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 Smoking status: Former Research scientist (life sciences)  . Smokeless tobacco: Never Used  Substance and Sexual Activity  . Alcohol use: Yes  . Drug use: No  . Sexual activity: Not on file  Other Topics Concern  . Not on file  Social History Narrative  . Not on file    Family History  Problem Relation Age of Onset  . Arthritis Other   . Cancer Other        colon, ovarian, breast and prostate cancer  . Heart disease Other   . Hypertension Other   . Diabetes Other     BP (!)  142/83   Pulse 75   Ht 5\' 3"  (1.6 m)   Wt 218 lb (98.9 kg)   BMI 38.62 kg/m   Review of Systems: See HPI above.     Objective:  Physical Exam:  Exam not repeated today. Gen: NAD, comfortable in exam room  Right knee: No gross deformity, ecchymoses, swelling. Mild TTP medial joint line. FROM with 5/5 strength flexion and extension. Negative ant/post drawers. Negative valgus/varus testing. Negative lachmanns. Negative mcmurrays, apleys, patellar apprehension. NV intact distally.  Left knee: No gross deformity, ecchymoses, swelling. No TTP. FROM with 5/5 strength flexion and extension. Negative ant/post drawers. Negative valgus/varus testing. Negative lachmanns. Negative mcmurrays, apleys, patellar apprehension. NV intact distally.   Assessment & Plan:  1. 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  Discussed tylenol, topical medications, supplements that may help, aleve.  Third orthovisc injection given today.  Will hold the fourth injection in case she needs this later.  After informed written consent timeout was performed, patient was seated on exam table. Right knee was prepped with alcohol swab and utilizing anteromedial approach, patient's right knee was injected intraarticularly with 56mL bupivicaine followed by orthovisc. Patient tolerated the procedure well without immediate complications.

## 2016-12-24 NOTE — Progress Notes (Signed)
Subjective:   Cynthia Barnett is a 71 y.o. female who presents for Medicare Annual (Subsequent) preventive examination.  Review of Systems:  No ROS.  Medicare Wellness Visit. Additional risk factors are reflected in the social history.    Sleep patterns: Sleeps 7 hours, feels rested.  Home Safety/Smoke Alarms: Feels safe in home. Smoke alarms in place.  Living environment; residence and Firearm Safety: Lives alone in 1 story home.  Seat Belt Safety/Bike Helmet: Wears seat belt.   Female:   [id number]       Mammo-09/26/2016, pt reports normal. [facility]  (will call for result) Dexa scan-06/09/2011, normal. Declines testing.        [facility]-Colonoscopy 01/26/2013 (pt reports), polyps. Recall 5 years. Dr. Earlean Shawl (will call for report)     Objective:     Vitals: There were no vitals taken for this visit.  There is no height or weight on file to calculate BMI.   Tobacco Social History   Tobacco Use  Smoking Status Former Smoker  Smokeless Tobacco Never Used     Counseling given: Not Answered   Past Medical History:  Diagnosis Date  . Allergy   . Arthritis   . Colon polyps   . Diabetes mellitus   . GERD (gastroesophageal reflux disease) 08/25/2012  . Hypertension   . Thyroid disease   . Uterine fibroid    Past Surgical History:  Procedure Laterality Date  . carpal tunnel  about 2005   right, Dr Fredna Dow  . thumb surgury  2003   dr Fredna Dow   Family History  Problem Relation Age of Onset  . Arthritis Other   . Cancer Other        colon, ovarian, breast and prostate cancer  . Heart disease Other   . Hypertension Other   . Diabetes Other    Social History   Substance and Sexual Activity  Sexual Activity Not on file    Outpatient Encounter Medications as of 12/25/2016  Medication Sig  . aspirin 81 MG tablet Take 81 mg by mouth daily.  Marland Kitchen azelastine (ASTELIN) 0.1 % nasal spray instill 1 spray into each nostril twice a day (Patient not taking: Reported on 12/02/2016)  .  azithromycin (ZITHROMAX) 250 MG tablet Take 2 tablets on Day 1. Then take 1 tablet daily.  . benzonatate (TESSALON) 100 MG capsule Take 1 capsule (100 mg total) 2 (two) times daily as needed by mouth for cough.  . Cholecalciferol (VITAMIN D-3 PO) Take by mouth daily.  Marland Kitchen CINNAMON PO Take by mouth.  . Estradiol 0.5 MG/0.5GM GEL Place onto the skin.  . fluticasone (FLONASE) 50 MCG/ACT nasal spray Place 1 spray into both nostrils daily.  Marland Kitchen glipiZIDE (GLUCOTROL XL) 5 MG 24 hr tablet Take 5 mg by mouth daily with breakfast.  . levothyroxine (SYNTHROID, LEVOTHROID) 150 MCG tablet Take 150 mcg by mouth daily before breakfast.  . liraglutide (VICTOZA) 18 MG/3ML SOPN Inject 1.8 mg into the skin daily.  Marland Kitchen lisinopril (PRINIVIL,ZESTRIL) 10 MG tablet Take 10 mg by mouth daily.  . metFORMIN (GLUCOPHAGE) 1000 MG tablet Take 1,000 mg by mouth 2 (two) times daily with a meal.  . pioglitazone (ACTOS) 15 MG tablet Take 15 mg by mouth daily.  . Triprolidine-Pseudoephedrine (ACTIFED PO) Take 1 tablet by mouth daily.   No facility-administered encounter medications on file as of 12/25/2016.     Activities of Daily Living In your present state of health, do you have any difficulty performing the following activities: 09/18/2016  Hearing? N  Vision? N  Difficulty concentrating or making decisions? N  Walking or climbing stairs? N  Dressing or bathing? N  Doing errands, shopping? N  Some recent data might be hidden    Patient Care Team: Leamon Arnt, MD as PCP - General (Family Medicine)    Assessment:    Physical assessment deferred to PCP.  Exercise Activities and Dietary recommendations   Diet (meal preparation, eat out, water intake, caffeinated beverages, dairy products, fruits and vegetables): Drinks water and coffee.   Eats heart healthy, low carb diet.      Goals    None     Fall Risk Fall Risk  12/02/2016 03/28/2013 08/25/2012  Falls in the past year? No No No   Depression Screen PHQ  2/9 Scores 12/02/2016 09/18/2016 03/28/2013 08/25/2012  PHQ - 2 Score 0 0 0 0  PHQ- 9 Score - 0 - -     Cognitive Function       Ad8 score reviewed for issues:  Issues making decisions: no  Less interest in hobbies / activities: no  Repeats questions, stories (family complaining): no  Trouble using ordinary gadgets (microwave, computer, phone): no  Forgets the month or year: no  Mismanaging finances: no  Remembering appts: no  Daily problems with thinking and/or memory: no Ad8 score is=0     Immunization History  Administered Date(s) Administered  . Influenza, High Dose Seasonal PF 11/02/2015  . Influenza-Unspecified 10/27/2010  . Pneumococcal Conjugate-13 05/27/2010, 03/28/2013  . Pneumococcal-Unspecified 03/28/2013  . Zoster 06/09/2010   Screening Tests Health Maintenance  Topic Date Due  . Hepatitis C Screening  04/21/1945  . OPHTHALMOLOGY EXAM  02/06/1956  . TETANUS/TDAP  02/05/1965  . HEMOGLOBIN A1C  09/28/2013  . FOOT EXAM  03/29/2014  . MAMMOGRAM  01/18/2015  . COLONOSCOPY  11/19/2020  . INFLUENZA VACCINE  Completed  . DEXA SCAN  Completed  . PNA vac Low Risk Adult  Completed      Plan:     Shingles vaccine at pharmacy.   Bring a copy of your living will and/or healthcare power of attorney to your next office visit.  Continue doing brain stimulating activities (puzzles, reading, adult coloring books, staying active) to keep memory sharp.   I have personally reviewed and noted the following in the patient's chart:   . Medical and social history . Use of alcohol, tobacco or illicit drugs  . Current medications and supplements . Functional ability and status . Nutritional status . Physical activity . Advanced directives . List of other physicians . Hospitalizations, surgeries, and ER visits in previous 12 months . Vitals . Screenings to include cognitive, depression, and falls . Referrals and appointments  In addition, I have reviewed and  discussed with patient certain preventive protocols, quality metrics, and best practice recommendations. A written personalized care plan for preventive services as well as general preventive health recommendations were provided to patient.    I have reviewed above and agree, Dr. Elmer Picker, RN  12/24/2016

## 2016-12-25 ENCOUNTER — Encounter: Payer: Self-pay | Admitting: Family Medicine

## 2016-12-25 ENCOUNTER — Ambulatory Visit: Payer: Medicare Other

## 2016-12-25 ENCOUNTER — Other Ambulatory Visit: Payer: Self-pay

## 2016-12-25 ENCOUNTER — Ambulatory Visit (INDEPENDENT_AMBULATORY_CARE_PROVIDER_SITE_OTHER): Payer: Medicare Other | Admitting: Family Medicine

## 2016-12-25 VITALS — BP 148/80 | HR 72 | Temp 98.0°F | Resp 18 | Ht 63.0 in | Wt 223.2 lb

## 2016-12-25 DIAGNOSIS — Z23 Encounter for immunization: Secondary | ICD-10-CM | POA: Diagnosis not present

## 2016-12-25 DIAGNOSIS — Z0000 Encounter for general adult medical examination without abnormal findings: Secondary | ICD-10-CM | POA: Diagnosis not present

## 2016-12-25 DIAGNOSIS — I10 Essential (primary) hypertension: Secondary | ICD-10-CM

## 2016-12-25 DIAGNOSIS — R911 Solitary pulmonary nodule: Secondary | ICD-10-CM | POA: Diagnosis not present

## 2016-12-25 DIAGNOSIS — Z1211 Encounter for screening for malignant neoplasm of colon: Secondary | ICD-10-CM

## 2016-12-25 DIAGNOSIS — M170 Bilateral primary osteoarthritis of knee: Secondary | ICD-10-CM | POA: Diagnosis not present

## 2016-12-25 DIAGNOSIS — E11620 Type 2 diabetes mellitus with diabetic dermatitis: Secondary | ICD-10-CM

## 2016-12-25 HISTORY — DX: Solitary pulmonary nodule: R91.1

## 2016-12-25 MED ORDER — LISINOPRIL-HYDROCHLOROTHIAZIDE 10-12.5 MG PO TABS: 1.0000 | ORAL_TABLET | Freq: Every day | ORAL | 3 refills | Status: DC

## 2016-12-25 MED ORDER — TRIAMCINOLONE ACETONIDE 0.1 % EX CREA: 1.0000 "application " | TOPICAL_CREAM | Freq: Two times a day (BID) | CUTANEOUS | 2 refills | Status: DC | PRN

## 2016-12-25 MED ORDER — ZOSTER VAC RECOMB ADJUVANTED 50 MCG/0.5ML IM SUSR: 0.5000 mL | Freq: Once | INTRAMUSCULAR | 1 refills | Status: AC

## 2016-12-25 NOTE — Progress Notes (Signed)
Subjective  CC:  Chief Complaint  Patient presents with  . Medicare Wellness  . Hypertension  . Rash  . Knee Pain    HPI: Cynthia Barnett is a 71 y.o. female who presents to the office today to address the problems listed above in the chief complaint. In addition to the AWV.  Hypertension f/u: Control is fair . Pt reports she is doing well. taking medications as instructed, no medication side effects noted, no TIAs, no chest pain on exertion, no dyspnea on exertion, noting swelling of ankles, noting orthopnea, no palpitations. I have reviewed elevated blood pressures with endocrinology. She is on low dose ace inhibitor only.  She denies adverse effects from his BP medications. Compliance with medication is good.   C/o dry itching rash on legs - at times is red. Has chronic diabetic skin changes. No warmth, pain or systemic sxs. Has noted some leg swelling. Rash is mainly below knees and comes and goes.  F/u OA bilateral knees: now seeing sports med; s/p orthovisc injections x 3 with great benefit. Leaving for [place] tomorrow and should do well walking.   Reviewed endocrine notes - diabetes and lipids now controlled.   Reviewed AVW- no risks identified.  HRT - per GYN. Discussed risks vs benefits. Pt uses them for QOL  Also is overdue for f/u Chest CT for pulmonary nodule surveillance. Respiratory ROS: no cough, shortness of breath, or wheezing  negative for - cough, hemoptysis, pleuritic pain or shortness of breath   BP Readings from Last 3 Encounters:  12/25/16 (!) 148/80  12/10/16 (!) 142/83  12/03/16 (!) 166/89   Wt Readings from Last 3 Encounters:  12/25/16 223 lb 3.2 oz (101.2 kg)  12/10/16 218 lb (98.9 kg)  12/03/16 220 lb (99.8 kg)    Lab Results  Component Value Date   CHOL 153 03/28/2013   CHOL 120 08/25/2012   CHOL 125 02/10/2012   Lab Results  Component Value Date   HDL 54.20 03/28/2013   HDL 50.50 08/25/2012   HDL 57.10 02/10/2012   Lab Results   Component Value Date   LDLCALC 88 03/28/2013   LDLCALC 61 08/25/2012   LDLCALC 61 02/10/2012   Lab Results  Component Value Date   TRIG 52.0 03/28/2013   TRIG 41.0 08/25/2012   TRIG 37.0 02/10/2012   Lab Results  Component Value Date   CHOLHDL 3 03/28/2013   CHOLHDL 2 08/25/2012   CHOLHDL 2 02/10/2012   No results found for: LDLDIRECT Lab Results  Component Value Date   CREATININE 0.8 03/28/2013   BUN 17 03/28/2013   NA 134 (L) 03/28/2013   K 4.0 03/28/2013   CL 97 03/28/2013   CO2 30 03/28/2013     I reviewed the patients updated PMH, FH, and SocHx.    Patient Active Problem List   Diagnosis Date Noted  . Postmenopausal HRT (hormone replacement therapy) 03/06/2016    Priority: High  . Controlled type 2 diabetes mellitus with diabetic neuropathy, without long-term current use of insulin ([place])     Priority: High  . Essential hypertension     Priority: High  . Hypothyroidism     Priority: High  . Bilateral primary osteoarthritis of knee 12/25/2016    Priority: Medium  . Left ear hearing loss 02/10/2012    Priority: Medium  . Colon polyps 06/09/2011    Priority: Medium  . GERD (gastroesophageal reflux disease) 08/25/2012    Priority: Low  . Solitary pulmonary nodule  on lung CT 12/25/2016  . Uterine fibroid    Allergies: Voltaren  [diclofenac sodium] and Sulfa antibiotics  Social History: Patient  reports that she has quit smoking. she has never used smokeless tobacco. She reports that she drinks alcohol. She reports that she does not use drugs.  Current Meds  Medication Sig  . azelastine (ASTELIN) 0.1 % nasal spray instill 1 spray into each nostril twice a day  . benzonatate (TESSALON) 100 MG capsule Take 1 capsule (100 mg total) 2 (two) times daily as needed by mouth for cough.  . Estradiol 0.5 MG/0.5GM GEL Place onto the skin.  . fluticasone (FLONASE) 50 MCG/ACT nasal spray Place 1 spray into both nostrils daily.  Marland Kitchen glipiZIDE (GLUCOTROL XL) 5 MG 24 hr  tablet Take 5 mg by mouth daily with breakfast.  . levothyroxine (SYNTHROID, LEVOTHROID) 150 MCG tablet Take 150 mcg by mouth daily before breakfast.  . liraglutide (VICTOZA) 18 MG/3ML SOPN Inject 1.8 mg into the skin daily.  . metFORMIN (GLUCOPHAGE) 1000 MG tablet Take 1,000 mg by mouth 2 (two) times daily with a meal.  . pioglitazone (ACTOS) 15 MG tablet Take 15 mg by mouth daily.  . Triprolidine-Pseudoephedrine (ACTIFED PO) Take 1 tablet by mouth daily.  . [DISCONTINUED] lisinopril (PRINIVIL,ZESTRIL) 10 MG tablet Take 10 mg by mouth daily.    Review of Systems: Cardiovascular: negative for chest pain, palpitations, leg swelling, orthopnea Respiratory: negative for SOB, wheezing or persistent cough Gastrointestinal: negative for abdominal pain Genitourinary: negative for dysuria or gross hematuria  Objective  Vitals: BP (!) 148/80 (BP Location: Left Arm, Patient Position: Sitting, Cuff Size: Normal)   Pulse 72   Temp 98 F (36.7 C) (Temporal)   Resp 18   Ht 5\' 3"  (1.6 m)   Wt 223 lb 3.2 oz (101.2 kg)   SpO2 97%   BMI 39.54 kg/m  General: no acute distress , obese, happy Psych:  Alert and oriented, normal mood and affect HEENT:  Normocephalic, atraumatic, supple neck  Cardiovascular:  RRR with 1/6 systolic murmur. + tr R>L B LE edema Respiratory:  Good breath sounds bilaterally, CTAB with normal respiratory effort Skin:  Warm, + rashes- hyperpigmented anterior legs with xerosis, no erythema or warmth, urticaria, papules or petechiae Neurologic:   Mental status is normal  Assessment  1. Essential hypertension   2. Encounter for Medicare annual wellness exam   3. Need for shingles vaccine   4. Bilateral primary osteoarthritis of knee   5. Pulmonary nodule seen on imaging study   6. 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 Solitary pulmonary nodule on lung CT      Plan    Hypertension f/u: BP control is marginally controlled. Change to lisinopril hctz for better bp  control and to decrease LE swelling.   Hyperlipidemia f/u: at goal  Leg rash: discussed rash and care. Moisturizers, eliminate swelling, avoid hot baths/showers, and use TAC 0.1% cream sparingly as needed.   Chest CT to be scheduled for surveillance. If stable, won't need further imaging.   Pt to set up shingrix vaccination at pharmacy  HRT per GYN. I do recommend a DEXA scan. Pt will schedule next year with mammogram. Education regarding management of these chronic disease states was given. Management strategies discussed on successive visits include dietary and exercise recommendations, goals of achieving and maintaining IBW, and lifestyle modifications aiming for adequate sleep and minimizing stressors.   Follow up: Return in about 3 months (around 03/25/2017) for follow up Hypertension.  Commons side effects, risks, benefits, and alternatives for medications and treatment plan prescribed today were discussed, and the patient expressed understanding of the given instructions. Patient is instructed to call or message via MyChart if he/she has any questions or concerns regarding our treatment plan. No barriers to understanding were identified. We discussed Red Flag symptoms and signs in detail. Patient expressed understanding regarding what to do in case of urgent or emergency type symptoms.   Medication list was reconciled, printed and provided to the patient in AVS. Patient instructions and summary information was reviewed with the patient as documented in the AVS. This note was prepared with assistance of Dragon voice recognition software. Occasional wrong-word or sound-a-like substitutions may have occurred due to the inherent limitations of voice recognition software  Orders Placed This Encounter  Procedures  . CT Chest Wo Contrast   Meds ordered this encounter  Medications  . Zoster Vaccine Adjuvanted [facility]) injection    Sig: Inject 0.5 mLs into the muscle once for 1 dose.     Dispense:  0.5 mL    Refill:  1  . lisinopril-hydrochlorothiazide (PRINZIDE,ZESTORETIC) 10-12.5 MG tablet    Sig: Take 1 tablet by mouth daily.    Dispense:  90 tablet    Refill:  3  . triamcinolone cream (KENALOG) 0.1 %    Sig: Apply 1 application topically 2 (two) times daily as needed.    Dispense:  45 g    Refill:  2

## 2016-12-25 NOTE — Patient Instructions (Addendum)
It was so good seeing you again! Thank you for establishing with my new practice and allowing me to continue caring for you. It means a lot to me.   Please return in 3 months for follow up on your blood pressure.   We will call you to set up an appointment for your follow up chest CT.    Shingles vaccine at pharmacy.   Bring a copy of your living will and/or healthcare power of attorney to your next office visit.  Continue doing brain stimulating activities (puzzles, reading, adult coloring books, staying active) to keep memory sharp.   Health Maintenance, Female Adopting a healthy lifestyle and getting preventive care can go a long way to promote health and wellness. Talk with your health care provider about what schedule of regular examinations is right for you. This is a good chance for you to check in with your provider about disease prevention and staying healthy. In between checkups, there are plenty of things you can do on your own. Experts have done a lot of research about which lifestyle changes and preventive measures are most likely to keep you healthy. Ask your health care provider for more information. Weight and diet Eat a healthy diet  Be sure to include plenty of vegetables, fruits, low-fat dairy products, and lean protein.  Do not eat a lot of foods high in solid fats, added sugars, or salt.  Get regular exercise. This is one of the most important things you can do for your health. ? Most adults should exercise for at least 150 minutes each week. The exercise should increase your heart rate and make you sweat (moderate-intensity exercise). ? Most adults should also do strengthening exercises at least twice a week. This is in addition to the moderate-intensity exercise.  Maintain a healthy weight  Body mass index (BMI) is a measurement that can be used to identify possible weight problems. It estimates body fat based on height and weight. Your health care provider can  help determine your BMI and help you achieve or maintain a healthy weight.  For females 37 years of age and older: ? A BMI below 18.5 is considered underweight. ? A BMI of 18.5 to 24.9 is normal. ? A BMI of 25 to 29.9 is considered overweight. ? A BMI of 30 and above is considered obese.  Watch levels of cholesterol and blood lipids  You should start having your blood tested for lipids and cholesterol at 70 years of age, then have this test every 5 years.  You may need to have your cholesterol levels checked more often if: ? Your lipid or cholesterol levels are high. ? You are older than 71 years of age. ? You are at high risk for heart disease.  Cancer screening Lung Cancer  Lung cancer screening is recommended for adults 27-83 years old who are at high risk for lung cancer because of a history of smoking.  A yearly low-dose CT scan of the lungs is recommended for people who: ? Currently smoke. ? Have quit within the past 15 years. ? Have at least a 30-pack-year history of smoking. A pack year is smoking an average of one pack of cigarettes a day for 1 year.  Yearly screening should continue until it has been 15 years since you quit.  Yearly screening should stop if you develop a health problem that would prevent you from having lung cancer treatment.  Breast Cancer  Practice breast self-awareness. This means understanding how your  breasts normally appear and feel.  It also means doing regular breast self-exams. Let your health care provider know about any changes, no matter how small.  If you are in your 20s or 30s, you should have a clinical breast exam (CBE) by a health care provider every 1-3 years as part of a regular health exam.  If you are 5 or older, have a CBE every year. Also consider having a breast X-ray (mammogram) every year.  If you have a family history of breast cancer, talk to your health care provider about genetic screening.  If you are at high risk  for breast cancer, talk to your health care provider about having an MRI and a mammogram every year.  Breast cancer gene (BRCA) assessment is recommended for women who have family members with BRCA-related cancers. BRCA-related cancers include: ? Breast. ? Ovarian. ? Tubal. ? Peritoneal cancers.  Results of the assessment will determine the need for genetic counseling and BRCA1 and BRCA2 testing.  Cervical Cancer Your health care provider may recommend that you be screened regularly for cancer of the pelvic organs (ovaries, uterus, and vagina). This screening involves a pelvic examination, including checking for microscopic changes to the surface of your cervix (Pap test). You may be encouraged to have this screening done every 3 years, beginning at age 38.  For women ages 57-65, health care providers may recommend pelvic exams and Pap testing every 3 years, or they may recommend the Pap and pelvic exam, combined with testing for human papilloma virus (HPV), every 5 years. Some types of HPV increase your risk of cervical cancer. Testing for HPV may also be done on women of any age with unclear Pap test results.  Other health care providers may not recommend any screening for nonpregnant women who are considered low risk for pelvic cancer and who do not have symptoms. Ask your health care provider if a screening pelvic exam is right for you.  If you have had past treatment for cervical cancer or a condition that could lead to cancer, you need Pap tests and screening for cancer for at least 20 years after your treatment. If Pap tests have been discontinued, your risk factors (such as having a new sexual partner) need to be reassessed to determine if screening should resume. Some women have medical problems that increase the chance of getting cervical cancer. In these cases, your health care provider may recommend more frequent screening and Pap tests.  Colorectal Cancer  This type of cancer can be  detected and often prevented.  Routine colorectal cancer screening usually begins at 70 years of age and continues through 70 years of age.  Your health care provider may recommend screening at an earlier age if you have risk factors for colon cancer.  Your health care provider may also recommend using home test kits to check for hidden blood in the stool.  A small camera at the end of a tube can be used to examine your colon directly (sigmoidoscopy or colonoscopy). This is done to check for the earliest forms of colorectal cancer.  Routine screening usually begins at age 22.  Direct examination of the colon should be repeated every 5-10 years through 69 years of age. However, you may need to be screened more often if early forms of precancerous polyps or small growths are found.  Skin Cancer  Check your skin from head to toe regularly.  Tell your health care provider about any new moles or changes in  moles, especially if there is a change in a mole's shape or color.  Also tell your health care provider if you have a mole that is larger than the size of a pencil eraser.  Always use sunscreen. Apply sunscreen liberally and repeatedly throughout the day.  Protect yourself by wearing long sleeves, pants, a wide-brimmed hat, and sunglasses whenever you are outside.  Heart disease, diabetes, and high blood pressure  High blood pressure causes heart disease and increases the risk of stroke. High blood pressure is more likely to develop in: ? People who have blood pressure in the high end of the normal range (130-139/85-89 mm Hg). ? People who are overweight or obese. ? People who are African American.  If you are 74-93 years of age, have your blood pressure checked every 3-5 years. If you are 31 years of age or older, have your blood pressure checked every year. You should have your blood pressure measured twice-once when you are at a hospital or clinic, and once when you are not at a  hospital or clinic. Record the average of the two measurements. To check your blood pressure when you are not at a hospital or clinic, you can use: ? An automated blood pressure machine at a pharmacy. ? A home blood pressure monitor.  If you are between 76 years and 93 years old, ask your health care provider if you should take aspirin to prevent strokes.  Have regular diabetes screenings. This involves taking a blood sample to check your fasting blood sugar level. ? If you are at a normal weight and have a low risk for diabetes, have this test once every three years after 70 years of age. ? If you are overweight and have a high risk for diabetes, consider being tested at a younger age or more often. Preventing infection Hepatitis B  If you have a higher risk for hepatitis B, you should be screened for this virus. You are considered at high risk for hepatitis B if: ? You were born in a country where hepatitis B is common. Ask your health care provider which countries are considered high risk. ? Your parents were born in a high-risk country, and you have not been immunized against hepatitis B (hepatitis B vaccine). ? You have HIV or AIDS. ? You use needles to inject street drugs. ? You live with someone who has hepatitis B. ? You have had sex with someone who has hepatitis B. ? You get hemodialysis treatment. ? You take certain medicines for conditions, including cancer, organ transplantation, and autoimmune conditions.  Hepatitis C  Blood testing is recommended for: ? Everyone born from [birth year] through 1964. ? Anyone with known risk factors for hepatitis C.  Sexually transmitted infections (STIs)  You should be screened for sexually transmitted infections (STIs) including gonorrhea and chlamydia if: ? You are sexually active and are younger than 70 years of age. ? You are older than 70 years of age and your health care provider tells you that you are at risk for this type of  infection. ? Your sexual activity has changed since you were last screened and you are at an increased risk for chlamydia or gonorrhea. Ask your health care provider if you are at risk.  If you do not have HIV, but are at risk, it may be recommended that you take a prescription medicine daily to prevent HIV infection. This is called pre-exposure prophylaxis (PrEP). You are considered at risk if: ? You are  sexually active and do not regularly use condoms or know the HIV status of your partner(s). ? You take drugs by injection. ? You are sexually active with a partner who has HIV.  Talk with your health care provider about whether you are at high risk of being infected with HIV. If you choose to begin PrEP, you should first be tested for HIV. You should then be tested every 3 months for as long as you are taking PrEP. Pregnancy  If you are premenopausal and you may become pregnant, ask your health care provider about preconception counseling.  If you may become pregnant, take 400 to 800 micrograms (mcg) of folic acid every day.  If you want to prevent pregnancy, talk to your health care provider about birth control (contraception). Osteoporosis and menopause  Osteoporosis is a disease in which the bones lose minerals and strength with aging. This can result in serious bone fractures. Your risk for osteoporosis can be identified using a bone density scan.  If you are 35 years of age or older, or if you are at risk for osteoporosis and fractures, ask your health care provider if you should be screened.  Ask your health care provider whether you should take a calcium or vitamin D supplement to lower your risk for osteoporosis.  Menopause may have certain physical symptoms and risks.  Hormone replacement therapy may reduce some of these symptoms and risks. Talk to your health care provider about whether hormone replacement therapy is right for you. Follow these instructions at home:  Schedule  regular health, dental, and eye exams.  Stay current with your immunizations.  Do not use any tobacco products including cigarettes, chewing tobacco, or electronic cigarettes.  If you are pregnant, do not drink alcohol.  If you are breastfeeding, limit how much and how often you drink alcohol.  Limit alcohol intake to no more than 1 drink per day for nonpregnant women. One drink equals 12 ounces of beer, 5 ounces of wine, or 1 ounces of hard liquor.  Do not use street drugs.  Do not share needles.  Ask your health care provider for help if you need support or information about quitting drugs.  Tell your health care provider if you often feel depressed.  Tell your health care provider if you have ever been abused or do not feel safe at home. This information is not intended to replace advice given to you by your health care provider. Make sure you discuss any questions you have with your health care provider. Document Released: 07/28/2010 Document Revised: 06/20/2015 Document Reviewed: 10/16/2014 Elsevier Interactive Patient Education  [employer].

## 2017-01-08 ENCOUNTER — Other Ambulatory Visit: Payer: Self-pay

## 2017-01-08 ENCOUNTER — Encounter: Payer: Self-pay | Admitting: Family Medicine

## 2017-01-08 ENCOUNTER — Ambulatory Visit: Payer: Self-pay | Admitting: Family Medicine

## 2017-01-08 ENCOUNTER — Ambulatory Visit (INDEPENDENT_AMBULATORY_CARE_PROVIDER_SITE_OTHER): Payer: Medicare Other | Admitting: Family Medicine

## 2017-01-08 VITALS — BP 146/80 | HR 89 | Temp 98.0°F | Ht 63.0 in | Wt 223.5 lb

## 2017-01-08 DIAGNOSIS — J069 Acute upper respiratory infection, unspecified: Secondary | ICD-10-CM

## 2017-01-08 MED ORDER — FLUTICASONE PROPIONATE 50 MCG/ACT NA SUSP: 1.0000 | Freq: Every day | NASAL | 0 refills | Status: DC

## 2017-01-08 MED ORDER — AZELASTINE HCL 0.1 % NA SOLN: 1.0000 | Freq: Two times a day (BID) | NASAL | 0 refills | Status: DC

## 2017-01-08 NOTE — Progress Notes (Signed)
Subjective   CC:  Chief Complaint  Patient presents with  . Nasal Congestion    since Monday  . head congestion    HPI: Cynthia Barnett is a 71 y.o. female who presents to the office today to address the problems listed above in the chief complaint.  Patient complains of typical URI symptoms including nasal congestion, mild sore throat, cough, and mild malaise.  The symptoms have been present for several days.Shedenies high fever or productive cough shortness of breath or significant GI symptoms.  Over-the-counter cold medicines have been minimally helpful. Daughter with same. Returned from [place] world last week.  I reviewed the patients updated PMH, FH, and SocHx.    Patient Active Problem List   Diagnosis Date Noted  . Postmenopausal HRT (hormone replacement therapy) 03/06/2016    Priority: High  . Controlled type 2 diabetes mellitus with diabetic neuropathy, without long-term current use of insulin ([place])     Priority: High  . Essential hypertension     Priority: High  . Hypothyroidism     Priority: High  . Bilateral primary osteoarthritis of knee 12/25/2016    Priority: Medium  . Left ear hearing loss 02/10/2012    Priority: Medium  . Colon polyps 06/09/2011    Priority: Medium  . GERD (gastroesophageal reflux disease) 08/25/2012    Priority: Low  . Solitary pulmonary nodule on lung CT 12/25/2016  . Uterine fibroid    Current Meds  Medication Sig  . aspirin 81 MG tablet Take 81 mg by mouth daily.  . Cholecalciferol (VITAMIN D-3 PO) Take by mouth daily.  . Estradiol 0.5 MG/0.5GM GEL Place onto the skin.  Marland Kitchen glipiZIDE (GLUCOTROL XL) 5 MG 24 hr tablet Take 5 mg by mouth daily with breakfast.  . levothyroxine (SYNTHROID, LEVOTHROID) 150 MCG tablet Take 150 mcg by mouth daily before breakfast.  . liraglutide (VICTOZA) 18 MG/3ML SOPN Inject 1.8 mg into the skin daily.  Marland Kitchen lisinopril-hydrochlorothiazide (PRINZIDE,ZESTORETIC) 10-12.5 MG tablet Take 1 tablet by mouth daily.    . metFORMIN (GLUCOPHAGE) 1000 MG tablet Take 1,000 mg by mouth 2 (two) times daily with a meal.  . pioglitazone (ACTOS) 15 MG tablet Take 15 mg by mouth daily.  Marland Kitchen triamcinolone cream (KENALOG) 0.1 % Apply 1 application topically 2 (two) times daily as needed.  . Triprolidine-Pseudoephedrine (ACTIFED PO) Take 1 tablet by mouth daily.    Review of Systems: Constitutional: Negative for fever malaise or anorexia Cardiovascular: negative for chest pain Respiratory: negative for SOB or pleuritic chest pain Gastrointestinal: negative for abdominal pain  Objective  Vitals: BP (!) 146/80 (BP Location: Left Arm, Patient Position: Sitting, Cuff Size: Large)   Pulse 89   Temp 98 F (36.7 C) (Oral)   Ht 5\' 3"  (1.6 m)   Wt 223 lb 8 oz (101.4 kg)   SpO2 99%   BMI 39.59 kg/m  General: no acute respiratory distress  Psych:  Alert and oriented, normal mood and affect HEENT: Normocephalic, nasal congestion present, TMs w/o erythema, OP with erythema w/o exudate, + cervical LAD, supple neck  Cardiovascular:  RRR without murmur or gallop. no peripheral edema Respiratory:  Good breath sounds bilaterally, CTAB with normal respiratory effort Skin:  Warm, no rashes Neurologic:   Mental status is normal. normal gait  Assessment  1. Viral upper respiratory tract infection      Plan   Educated on likely viral etiology requiring supportive care. Stop afrin and sudafed. Change to mucinex dm and nasal sprays as  ordered. Discussed red flag sxs or signs of sinusitis.   Follow up: prn    Commons side effects, risks, benefits, and alternatives for medications and treatment plan prescribed today were discussed, and the patient expressed understanding of the given instructions. Patient is instructed to call or message via MyChart if he/she has any questions or concerns regarding our treatment plan. No barriers to understanding were identified. We discussed Red Flag symptoms and signs in detail. Patient  expressed understanding regarding what to do in case of urgent or emergency type symptoms.   Medication list was reconciled, printed and provided to the patient in AVS. Patient instructions and summary information was reviewed with the patient as documented in the AVS. This note was prepared with assistance of Dragon voice recognition software. Occasional wrong-word or sound-a-like substitutions may have occurred due to the inherent limitations of voice recognition software  No orders of the defined types were placed in this encounter.  Meds ordered this encounter  Medications  . azelastine (ASTELIN) 0.1 % nasal spray    Sig: Place 1 spray into both nostrils 2 (two) times daily.    Dispense:  30 mL    Refill:  0  . fluticasone (FLONASE) 50 MCG/ACT nasal spray    Sig: Place 1 spray into both nostrils daily.    Dispense:  16 g    Refill:  0

## 2017-01-08 NOTE — Patient Instructions (Addendum)
Mucinex DM twice a day and stop the sudafed. Return as needed.   Upper Respiratory Infection, Adult Most upper respiratory infections (URIs) are caused by a virus. A URI affects the nose, throat, and upper air passages. The most common type of URI is often called "the common cold." Follow these instructions at home:  Take medicines only as told by your doctor.  Gargle warm saltwater or take cough drops to comfort your throat as told by your doctor.  Use a warm mist humidifier or inhale steam from a shower to increase air moisture. This may make it easier to breathe.  Drink enough fluid to keep your pee (urine) clear or pale yellow.  Eat soups and other clear broths.  Have a healthy diet.  Rest as needed.  Go back to work when your fever is gone or your doctor says it is okay. ? You may need to stay home longer to avoid giving your URI to others. ? You can also wear a face mask and wash your hands often to prevent spread of the virus.  Use your inhaler more if you have asthma.  Do not use any tobacco products, including cigarettes, chewing tobacco, or electronic cigarettes. If you need help quitting, ask your doctor. Contact a doctor if:  You are getting worse, not better.  Your symptoms are not helped by medicine.  You have chills.  You are getting more short of breath.  You have brown or red mucus.  You have yellow or brown discharge from your nose.  You have pain in your face, especially when you bend forward.  You have a fever.  You have puffy (swollen) neck glands.  You have pain while swallowing.  You have white areas in the back of your throat. Get help right away if:  You have very bad or constant: ? Headache. ? Ear pain. ? Pain in your forehead, behind your eyes, and over your cheekbones (sinus pain). ? Chest pain.  You have long-lasting (chronic) lung disease and any of the following: ? Wheezing. ? Long-lasting cough. ? Coughing up blood. ? A  change in your usual mucus.  You have a stiff neck.  You have changes in your: ? Vision. ? Hearing. ? Thinking. ? Mood. This information is not intended to replace advice given to you by your health care provider. Make sure you discuss any questions you have with your health care provider. Document Released: 07/01/2007 Document Revised: 09/15/2015 Document Reviewed: 04/19/2013 Elsevier Interactive Patient Education  2018 [employer].

## 2017-01-15 ENCOUNTER — Other Ambulatory Visit: Payer: Self-pay

## 2017-01-21 ENCOUNTER — Ambulatory Visit
Admission: RE | Admit: 2017-01-21 | Discharge: 2017-01-21 | Disposition: A | Discharge status: Home / Self Care | Payer: Medicare Other | Source: Ambulatory Visit | Attending: Family Medicine | Admitting: Family Medicine

## 2017-01-21 ENCOUNTER — Encounter: Payer: Self-pay | Admitting: Family Medicine

## 2017-01-21 DIAGNOSIS — K760 Fatty (change of) liver, not elsewhere classified: Secondary | ICD-10-CM

## 2017-01-21 DIAGNOSIS — R918 Other nonspecific abnormal finding of lung field: Secondary | ICD-10-CM | POA: Diagnosis not present

## 2017-01-21 DIAGNOSIS — R911 Solitary pulmonary nodule: Secondary | ICD-10-CM

## 2017-01-21 HISTORY — DX: Fatty (change of) liver, not elsewhere classified: K76.0

## 2017-03-16 ENCOUNTER — Encounter: Payer: Self-pay | Admitting: Family Medicine

## 2017-03-19 DIAGNOSIS — D2271 Melanocytic nevi of right lower limb, including hip: Secondary | ICD-10-CM | POA: Diagnosis not present

## 2017-03-19 DIAGNOSIS — D1801 Hemangioma of skin and subcutaneous tissue: Secondary | ICD-10-CM | POA: Diagnosis not present

## 2017-03-19 DIAGNOSIS — D2239 Melanocytic nevi of other parts of face: Secondary | ICD-10-CM | POA: Diagnosis not present

## 2017-03-19 DIAGNOSIS — L7211 Pilar cyst: Secondary | ICD-10-CM | POA: Diagnosis not present

## 2017-03-19 DIAGNOSIS — D2261 Melanocytic nevi of right upper limb, including shoulder: Secondary | ICD-10-CM | POA: Diagnosis not present

## 2017-03-19 DIAGNOSIS — B078 Other viral warts: Secondary | ICD-10-CM | POA: Diagnosis not present

## 2017-03-19 DIAGNOSIS — L720 Epidermal cyst: Secondary | ICD-10-CM | POA: Diagnosis not present

## 2017-03-19 DIAGNOSIS — D2262 Melanocytic nevi of left upper limb, including shoulder: Secondary | ICD-10-CM | POA: Diagnosis not present

## 2017-03-19 DIAGNOSIS — L821 Other seborrheic keratosis: Secondary | ICD-10-CM | POA: Diagnosis not present

## 2017-03-19 DIAGNOSIS — D225 Melanocytic nevi of trunk: Secondary | ICD-10-CM | POA: Diagnosis not present

## 2017-03-19 DIAGNOSIS — L918 Other hypertrophic disorders of the skin: Secondary | ICD-10-CM | POA: Diagnosis not present

## 2017-03-19 DIAGNOSIS — D2272 Melanocytic nevi of left lower limb, including hip: Secondary | ICD-10-CM | POA: Diagnosis not present

## 2017-03-23 ENCOUNTER — Encounter: Payer: Self-pay | Admitting: Family Medicine

## 2017-03-23 ENCOUNTER — Other Ambulatory Visit: Payer: Self-pay

## 2017-03-23 ENCOUNTER — Ambulatory Visit (INDEPENDENT_AMBULATORY_CARE_PROVIDER_SITE_OTHER): Payer: Medicare Other | Admitting: Family Medicine

## 2017-03-23 VITALS — BP 132/80 | HR 80 | Temp 97.6°F | Ht 63.0 in | Wt 228.4 lb

## 2017-03-23 DIAGNOSIS — I10 Essential (primary) hypertension: Secondary | ICD-10-CM | POA: Diagnosis not present

## 2017-03-23 DIAGNOSIS — K1120 Sialoadenitis, unspecified: Secondary | ICD-10-CM | POA: Diagnosis not present

## 2017-03-23 NOTE — Patient Instructions (Addendum)
Please return in 6 months for recheck your blood pressure  If you have any questions or concerns, please don't hesitate to send me a message via MyChart or call the office at [phone]. Thank you for visiting with [place] today! It's our pleasure caring for you.    Parotitis Parotitis means that you have irritation and swelling (inflammation) in one or both of your parotid glands. These glands make spit (saliva). They are found on each side of your face, below and in front of your earlobes. You may or may not have pain with this condition. Follow these instructions at home: Medicines  Take over-the-counter and prescription medicines only as told by your doctor.  If you were prescribed an antibiotic medicine, take it as told by your doctor. Do not stop taking the antibiotic even if you start to feel better. Managing pain and swelling  Apply warm cloths (compresses) to the swollen area as told by your doctor.  Gently rub your parotid glands as told by your doctor. General instructions   Drink enough fluid to keep your pee (urine) clear or pale yellow.  Suck on sour candy. This may help: ? To make your mouth less dry. ? To make more spit.  Keep your mouth clean and moist. ? Gargle with a salt-water mixture 3-4 times per day, or as needed. ? To make a salt-water mixture, stir -1 tsp of salt into 1 cup of warm water.  Take good care of your mouth: ? Brush your teeth at least two times per day. ? Floss your teeth every day. ? See your dentist regularly.  Do not use tobacco products. These include cigarettes, chewing tobacco, or e-cigarettes. If you need help quitting,  ask your doctor.  Keep all follow-up visits as told by your doctor. This is important. Contact a doctor if:  You have a fever or chills.  You have new symptoms.  Your symptoms get worse.  Your symptoms do not get better with treatment. This information is not intended to replace advice given to you by your  health care provider. Make sure you discuss any questions you have with your health care provider. Document Released: 02/14/2010 Document Revised: 06/20/2015 Document Reviewed: 06/07/2014 Elsevier Interactive Patient Education  [employer].

## 2017-03-23 NOTE — Progress Notes (Signed)
Subjective  CC:  Chief Complaint  Patient presents with  . Lymphnode Swelling    Patient states that she had Stomach virus on Saturday and then have felt swelling since then     HPI: Cynthia Barnett is a 72 y.o. female who presents to the office today to address the problems listed above in the chief complaint.  As above, has stomach virus over weekend.  Recovered well but with left facial swelling with tenderness and pain with tart foods. No fevers, chills, pus noted in mouth or h/o sialadenitis. No sore throat. Otherwise feeling well. The gland swelling has improved over the last 24 hours.   Hypertension f/u: increased bp meds at November office visit; doing better now. Home bps normal. No cp or sob. No leg swelling or AEs.   I reviewed the patients updated PMH, FH, and SocHx.    Patient Active Problem List   Diagnosis Date Noted  . Postmenopausal HRT (hormone replacement therapy) 03/06/2016    Priority: High  . Controlled type 2 diabetes mellitus with diabetic neuropathy, without long-term current use of insulin ([place])     Priority: High  . Essential hypertension     Priority: High  . Hypothyroidism     Priority: High  . Bilateral primary osteoarthritis of knee 12/25/2016    Priority: Medium  . Left ear hearing loss 02/10/2012    Priority: Medium  . Colon polyps 06/09/2011    Priority: Medium  . GERD (gastroesophageal reflux disease) 08/25/2012    Priority: Low  . Fatty infiltration of liver 01/21/2017  . Solitary pulmonary nodule on lung CT 12/25/2016  . Uterine fibroid    Current Meds  Medication Sig  . aspirin 81 MG tablet Take 81 mg by mouth daily.  Marland Kitchen azelastine (ASTELIN) 0.1 % nasal spray Place 1 spray into both nostrils 2 (two) times daily.  . Cholecalciferol (VITAMIN D-3 PO) Take by mouth daily.  . Estradiol 0.5 MG/0.5GM GEL Place onto the skin.  . fluticasone (FLONASE) 50 MCG/ACT nasal spray Place 1 spray into both nostrils daily.  Marland Kitchen glipiZIDE (GLUCOTROL XL)  5 MG 24 hr tablet Take 5 mg by mouth daily with breakfast.  . levothyroxine (SYNTHROID, LEVOTHROID) 150 MCG tablet Take 150 mcg by mouth daily before breakfast.  . liraglutide (VICTOZA) 18 MG/3ML SOPN Inject 1.8 mg into the skin daily.  Marland Kitchen lisinopril-hydrochlorothiazide (PRINZIDE,ZESTORETIC) 10-12.5 MG tablet Take 1 tablet by mouth daily.  . metFORMIN (GLUCOPHAGE) 1000 MG tablet Take 1,000 mg by mouth 2 (two) times daily with a meal.  . pioglitazone (ACTOS) 15 MG tablet Take 15 mg by mouth daily.  . Triprolidine-Pseudoephedrine (ACTIFED PO) Take 1 tablet by mouth daily.    Allergies: Patient is allergic to voltaren  [diclofenac sodium] and sulfa antibiotics. Family History: Patient family history includes Arthritis in her father and other; Cancer in her father, mother, other, and son; Diabetes in her father and other; Endometrial cancer in her mother; Heart disease in her other; Heart failure in her father; Hypertension in her father and other; Lymphoma in her sister; Thyroid disease in her daughter. Social History:  Patient  reports that she has quit smoking. she has never used smokeless tobacco. She reports that she drinks alcohol. She reports that she does not use drugs.  Review of Systems: Constitutional: Negative for fever malaise or anorexia Cardiovascular: negative for chest pain Respiratory: negative for SOB or persistent cough Gastrointestinal: negative for abdominal pain  Objective  Vitals: BP 132/80   Pulse  80   Temp 97.6 F (36.4 C)   Ht 5\' 3"  (1.6 m)   Wt 228 lb 6.4 oz (103.6 kg)   BMI 40.46 kg/m  General: no acute distress , A&Ox3 HEENT: PEERL, conjunctiva normal, Oropharynx moist,neck is supple Left parotid gland is swollen and mildly tender, no palpable stone in wharton's duct, no visible pus, erythema or warmth. No submandibular gland swellings. TMs normal bilaterally, no cervical lymphadenopathy Cardiovascular:  RRR without murmur or gallop.  Respiratory:  Good  breath sounds bilaterally, CTAB with normal respiratory effort Skin:  Warm, no rashes  Assessment  1. Parotiditis   2. Essential hypertension      Plan   Viral vs obstructive:   No signs of suppurative infections. Treat conservatively with warm compresses, advil, tart foods, and hydration. Recheck if worsening or unresolving for imaging study and/or ent.   HTN is now well controlled. Continue current meds. Pt to get blood work at endocrinology office. Will request labs to be sent here for my review as well.   Follow up: Return in about 6 months (around 09/20/2017) for follow up Hypertension.    Commons side effects, risks, benefits, and alternatives for medications and treatment plan prescribed today were discussed, and the patient expressed understanding of the given instructions. Patient is instructed to call or message via MyChart if he/she has any questions or concerns regarding our treatment plan. No barriers to understanding were identified. We discussed Red Flag symptoms and signs in detail. Patient expressed understanding regarding what to do in case of urgent or emergency type symptoms.   Medication list was reconciled, printed and provided to the patient in AVS. Patient instructions and summary information was reviewed with the patient as documented in the AVS. This note was prepared with assistance of Dragon voice recognition software. Occasional wrong-word or sound-a-like substitutions may have occurred due to the inherent limitations of voice recognition software  No orders of the defined types were placed in this encounter.  No orders of the defined types were placed in this encounter.

## 2017-03-26 ENCOUNTER — Ambulatory Visit: Payer: Self-pay | Admitting: Family Medicine

## 2017-03-30 ENCOUNTER — Ambulatory Visit: Payer: Self-pay | Admitting: Family Medicine

## 2017-04-12 ENCOUNTER — Other Ambulatory Visit: Payer: Self-pay | Admitting: Family Medicine

## 2017-04-16 ENCOUNTER — Ambulatory Visit: Payer: Self-pay | Admitting: Family Medicine

## 2017-05-13 DIAGNOSIS — E1159 Type 2 diabetes mellitus with other circulatory complications: Secondary | ICD-10-CM | POA: Diagnosis not present

## 2017-05-13 DIAGNOSIS — E1129 Type 2 diabetes mellitus with other diabetic kidney complication: Secondary | ICD-10-CM | POA: Diagnosis not present

## 2017-05-13 DIAGNOSIS — E1140 Type 2 diabetes mellitus with diabetic neuropathy, unspecified: Secondary | ICD-10-CM | POA: Diagnosis not present

## 2017-05-13 DIAGNOSIS — E039 Hypothyroidism, unspecified: Secondary | ICD-10-CM | POA: Diagnosis not present

## 2017-05-13 DIAGNOSIS — R809 Proteinuria, unspecified: Secondary | ICD-10-CM | POA: Diagnosis not present

## 2017-05-13 DIAGNOSIS — I10 Essential (primary) hypertension: Secondary | ICD-10-CM | POA: Diagnosis not present

## 2017-05-17 ENCOUNTER — Ambulatory Visit: Payer: Self-pay | Admitting: Family Medicine

## 2017-06-18 ENCOUNTER — Other Ambulatory Visit: Payer: Self-pay | Admitting: Family Medicine

## 2017-06-18 NOTE — Telephone Encounter (Signed)
Last OV: 03/21/2017 Fill Date: 12/2016

## 2017-07-19 DIAGNOSIS — E669 Obesity, unspecified: Secondary | ICD-10-CM | POA: Diagnosis not present

## 2017-07-19 DIAGNOSIS — Z01411 Encounter for gynecological examination (general) (routine) with abnormal findings: Secondary | ICD-10-CM | POA: Diagnosis not present

## 2017-07-19 DIAGNOSIS — L292 Pruritus vulvae: Secondary | ICD-10-CM | POA: Diagnosis not present

## 2017-07-19 DIAGNOSIS — Z8049 Family history of malignant neoplasm of other genital organs: Secondary | ICD-10-CM | POA: Diagnosis not present

## 2017-07-19 DIAGNOSIS — E119 Type 2 diabetes mellitus without complications: Secondary | ICD-10-CM | POA: Diagnosis not present

## 2017-07-19 DIAGNOSIS — Z79890 Hormone replacement therapy (postmenopausal): Secondary | ICD-10-CM | POA: Diagnosis not present

## 2017-07-19 DIAGNOSIS — R9389 Abnormal findings on diagnostic imaging of other specified body structures: Secondary | ICD-10-CM | POA: Diagnosis not present

## 2017-07-19 DIAGNOSIS — N898 Other specified noninflammatory disorders of vagina: Secondary | ICD-10-CM | POA: Diagnosis not present

## 2017-07-19 DIAGNOSIS — N3946 Mixed incontinence: Secondary | ICD-10-CM | POA: Diagnosis not present

## 2017-07-19 DIAGNOSIS — Z794 Long term (current) use of insulin: Secondary | ICD-10-CM | POA: Diagnosis not present

## 2017-07-19 DIAGNOSIS — Z1231 Encounter for screening mammogram for malignant neoplasm of breast: Secondary | ICD-10-CM | POA: Diagnosis not present

## 2017-07-19 DIAGNOSIS — Z6838 Body mass index (BMI) 38.0-38.9, adult: Secondary | ICD-10-CM | POA: Diagnosis not present

## 2017-07-19 DIAGNOSIS — Z803 Family history of malignant neoplasm of breast: Secondary | ICD-10-CM | POA: Diagnosis not present

## 2017-07-19 DIAGNOSIS — N840 Polyp of corpus uteri: Secondary | ICD-10-CM | POA: Diagnosis not present

## 2017-07-19 DIAGNOSIS — N859 Noninflammatory disorder of uterus, unspecified: Secondary | ICD-10-CM | POA: Diagnosis not present

## 2017-09-29 DIAGNOSIS — E039 Hypothyroidism, unspecified: Secondary | ICD-10-CM | POA: Diagnosis not present

## 2017-09-29 DIAGNOSIS — I10 Essential (primary) hypertension: Secondary | ICD-10-CM | POA: Diagnosis not present

## 2017-09-29 DIAGNOSIS — N840 Polyp of corpus uteri: Secondary | ICD-10-CM | POA: Diagnosis not present

## 2017-09-29 DIAGNOSIS — N888 Other specified noninflammatory disorders of cervix uteri: Secondary | ICD-10-CM | POA: Diagnosis not present

## 2017-09-29 DIAGNOSIS — M1990 Unspecified osteoarthritis, unspecified site: Secondary | ICD-10-CM | POA: Diagnosis not present

## 2017-09-29 DIAGNOSIS — Z79899 Other long term (current) drug therapy: Secondary | ICD-10-CM | POA: Diagnosis not present

## 2017-09-29 DIAGNOSIS — Z87891 Personal history of nicotine dependence: Secondary | ICD-10-CM | POA: Diagnosis not present

## 2017-09-29 DIAGNOSIS — Z79890 Hormone replacement therapy (postmenopausal): Secondary | ICD-10-CM | POA: Diagnosis not present

## 2017-09-29 DIAGNOSIS — Z30430 Encounter for insertion of intrauterine contraceptive device: Secondary | ICD-10-CM | POA: Diagnosis not present

## 2017-09-29 DIAGNOSIS — E119 Type 2 diabetes mellitus without complications: Secondary | ICD-10-CM | POA: Diagnosis not present

## 2017-09-29 DIAGNOSIS — Z7984 Long term (current) use of oral hypoglycemic drugs: Secondary | ICD-10-CM | POA: Diagnosis not present

## 2017-10-05 DIAGNOSIS — L292 Pruritus vulvae: Secondary | ICD-10-CM | POA: Diagnosis not present

## 2017-10-05 DIAGNOSIS — Z882 Allergy status to sulfonamides status: Secondary | ICD-10-CM | POA: Diagnosis not present

## 2017-10-05 DIAGNOSIS — N3946 Mixed incontinence: Secondary | ICD-10-CM | POA: Diagnosis not present

## 2017-10-05 DIAGNOSIS — Z7984 Long term (current) use of oral hypoglycemic drugs: Secondary | ICD-10-CM | POA: Diagnosis not present

## 2017-10-05 DIAGNOSIS — Z79890 Hormone replacement therapy (postmenopausal): Secondary | ICD-10-CM | POA: Diagnosis not present

## 2017-10-05 DIAGNOSIS — N840 Polyp of corpus uteri: Secondary | ICD-10-CM | POA: Diagnosis not present

## 2017-10-05 DIAGNOSIS — E119 Type 2 diabetes mellitus without complications: Secondary | ICD-10-CM | POA: Diagnosis not present

## 2017-10-05 DIAGNOSIS — Z886 Allergy status to analgesic agent status: Secondary | ICD-10-CM | POA: Diagnosis not present

## 2017-10-05 DIAGNOSIS — N905 Atrophy of vulva: Secondary | ICD-10-CM | POA: Diagnosis not present

## 2017-10-05 DIAGNOSIS — Z9889 Other specified postprocedural states: Secondary | ICD-10-CM | POA: Diagnosis not present

## 2017-11-05 DIAGNOSIS — E039 Hypothyroidism, unspecified: Secondary | ICD-10-CM | POA: Diagnosis not present

## 2017-11-05 DIAGNOSIS — R809 Proteinuria, unspecified: Secondary | ICD-10-CM | POA: Diagnosis not present

## 2017-11-05 DIAGNOSIS — E1129 Type 2 diabetes mellitus with other diabetic kidney complication: Secondary | ICD-10-CM | POA: Diagnosis not present

## 2017-11-11 DIAGNOSIS — E1159 Type 2 diabetes mellitus with other circulatory complications: Secondary | ICD-10-CM | POA: Diagnosis not present

## 2017-11-11 DIAGNOSIS — E1129 Type 2 diabetes mellitus with other diabetic kidney complication: Secondary | ICD-10-CM | POA: Diagnosis not present

## 2017-11-11 DIAGNOSIS — I10 Essential (primary) hypertension: Secondary | ICD-10-CM | POA: Diagnosis not present

## 2017-11-11 DIAGNOSIS — E039 Hypothyroidism, unspecified: Secondary | ICD-10-CM | POA: Diagnosis not present

## 2017-11-11 DIAGNOSIS — R809 Proteinuria, unspecified: Secondary | ICD-10-CM | POA: Diagnosis not present

## 2017-11-11 DIAGNOSIS — E1140 Type 2 diabetes mellitus with diabetic neuropathy, unspecified: Secondary | ICD-10-CM | POA: Diagnosis not present

## 2017-11-15 DIAGNOSIS — Z23 Encounter for immunization: Secondary | ICD-10-CM | POA: Diagnosis not present

## 2017-11-19 DIAGNOSIS — Z803 Family history of malignant neoplasm of breast: Secondary | ICD-10-CM | POA: Diagnosis not present

## 2017-11-19 DIAGNOSIS — Z1231 Encounter for screening mammogram for malignant neoplasm of breast: Secondary | ICD-10-CM | POA: Diagnosis not present

## 2017-11-19 LAB — HM MAMMOGRAPHY

## 2017-11-23 ENCOUNTER — Encounter: Payer: Self-pay | Admitting: Emergency Medicine

## 2017-11-26 ENCOUNTER — Ambulatory Visit (INDEPENDENT_AMBULATORY_CARE_PROVIDER_SITE_OTHER): Payer: Medicare Other | Admitting: Family Medicine

## 2017-11-26 ENCOUNTER — Encounter: Payer: Self-pay | Admitting: Family Medicine

## 2017-11-26 DIAGNOSIS — M1711 Unilateral primary osteoarthritis, right knee: Secondary | ICD-10-CM

## 2017-11-29 ENCOUNTER — Encounter: Payer: Self-pay | Admitting: Family Medicine

## 2017-11-29 NOTE — Progress Notes (Signed)
PCP: Leamon Arnt, MD  Subjective:   HPI: Patient is a 72 y.o. female here for right knee pain.  10/12: Patient reports she's had longstanding history of right knee pain and arthritis. Dates back to at least 2012. Pain is up to 6-7/10 now, sharp, anterior. Worse with walking. Pain also worse with stairs. No benefit with cortisone injections but supartz worked in the past for several months to years. Tried oral nsaid, diclofenac gel (gave her a rash). Used to use arnicare and topical hyaluronic acid. No skin changes, numbness.  10/25: Patient returns to start orthovisc series. Pain up to 8/10 now, more painful this week. No skin changes, numbness.  11/8: Patient is doing well - feels about 60-75% improvement following last shot. She has had some bruising at shot location. No numbness, other complaints.  12/10/16: Patient reports she's doing well. Pain level 0/10. No skin changes, numbness.  11/26/17: Patient reports she's done well with viscosupplementation until about a month ago. Pain anterior right knee 3/10 but up to 8/10 and sharp with walking. No skin changes, numbness.  Past Medical History:  Diagnosis Date  . Allergy   . Arthritis   . Colon polyps   . Diabetes mellitus   . Fatty infiltration of liver 01/21/2017   By CT scan 2018  . GERD (gastroesophageal reflux disease) 08/25/2012  . Hypertension   . Solitary pulmonary nodule on lung CT 12/25/2016   02/2016 - incidental finding; surveillance CT pending  . Thyroid disease   . Uterine fibroid     Current Outpatient Medications on File Prior to Visit  Medication Sig Dispense Refill  . aspirin 81 MG tablet Take 81 mg by mouth daily.    Marland Kitchen azelastine (ASTELIN) 0.1 % nasal spray INSTILL 1 SPRAY INTO EACH NOSTRIL TWICE A DAY 30 mL 5  . BD PEN NEEDLE NANO U/F 32G X 4 MM MISC See admin instructions.  0  . Cholecalciferol (VITAMIN D-3 PO) Take by mouth daily.    Marland Kitchen DIVIGEL 0.5 MG/0.5GM GEL PLACE 0.5MG  ON SKIN  ONCE DAILY 30 each 11  . fluticasone (FLONASE) 50 MCG/ACT nasal spray Place 1 spray into both nostrils daily. 16 g 0  . glipiZIDE (GLUCOTROL XL) 5 MG 24 hr tablet Take 5 mg by mouth daily with breakfast.    . levothyroxine (SYNTHROID, LEVOTHROID) 150 MCG tablet Take 150 mcg by mouth daily before breakfast.    . liraglutide (VICTOZA) 18 MG/3ML SOPN Inject 1.8 mg into the skin daily.    Marland Kitchen lisinopril-hydrochlorothiazide (PRINZIDE,ZESTORETIC) 10-12.5 MG tablet Take 1 tablet by mouth daily. 90 tablet 3  . metFORMIN (GLUCOPHAGE) 1000 MG tablet Take 1,000 mg by mouth 2 (two) times daily with a meal.    . OZEMPIC, 0.25 OR 0.5 MG/DOSE, 2 MG/1.5ML SOPN   4  . pioglitazone (ACTOS) 15 MG tablet Take 15 mg by mouth daily.    . Triprolidine-Pseudoephedrine (ACTIFED PO) Take 1 tablet by mouth daily.     No current facility-administered medications on file prior to visit.     Past Surgical History:  Procedure Laterality Date  . carpal tunnel  about 2005   right, Dr Fredna Dow  . CATARACT EXTRACTION, BILATERAL    . DILATION AND CURETTAGE OF UTERUS    . thumb surgury  2003   dr Fredna Dow    Allergies  Allergen Reactions  . Voltaren  [Diclofenac Sodium] Rash    Only the topical   . Sulfa Antibiotics Rash    Social History  Socioeconomic History  . Marital status: Divorced    Spouse name: Not on file  . Number of children: Not on file  . Years of education: 20  . Highest education level: Not on file  Occupational History  . 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 Financial resource strain: Not on file  . Food insecurity:    Worry: Not on file    Inability: Not on file  . Transportation needs:    Medical: Not on file    Non-medical: Not on file  Tobacco Use  . Smoking status: Former Research scientist (life sciences)  . Smokeless tobacco: Never Used  Substance and Sexual Activity  . Alcohol use: Yes  . Drug use: No  . Sexual activity: Not Currently  Lifestyle  . Physical activity:     Days per week: Not on file    Minutes per session: Not on file  . Stress: Not on file  Relationships  . Social connections:    Talks on phone: Not on file    Gets together: Not on file    Attends religious service: Not on file    Active member of club or organization: Not on file    Attends meetings of clubs or organizations: Not on file    Relationship status: Not on file  . Intimate partner violence:    Fear of current or ex partner: Not on file    Emotionally abused: Not on file    Physically abused: Not on file    Forced sexual activity: Not on file  Other Topics Concern  . Not on file  Social History Narrative  . Not on file    Family History  Problem Relation Age of Onset  . Arthritis Other   . Cancer Other        colon, ovarian, breast and prostate cancer  . Heart disease Other   . Hypertension Other   . Diabetes Other   . Cancer Mother        Breast Cancer  . Endometrial cancer Mother   . Arthritis Father   . Cancer Father        Basal Cell Carcinoma  . Diabetes Father   . Heart failure Father   . Hypertension Father   . Lymphoma Sister   . Thyroid disease Daughter   . Cancer Son        Basal Cell Carcinoma    BP 127/70   Pulse 85   Ht 5\' 3"  (1.6 m)   Wt 212 lb (96.2 kg)   BMI 37.55 kg/m   Review of Systems: See HPI above.     Objective:  Physical Exam:  Gen: NAD, comfortable in exam room  Right knee: No gross deformity, ecchymoses, swelling. TTP medial > lateral joint lines. ROM 0 - 90 degrees, 5/5 strength flexion and extension. Negative ant/post drawers. Negative valgus/varus testing. NV intact distally.   Assessment & Plan:  1. 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  Did well previously with viscosupplementation - will start again today.  Tylenol, topical medications, supplements, aleve if needed.  F/u in 1 week for second injection.  After informed written consent timeout was performed, patient was seated on exam table. Right knee was prepped  with alcohol swab and utilizing anteromedial approach, patient's right knee was injected intraarticularly with 66mL bupivicaine followed by orthovisc. Patient tolerated the procedure well without immediate complications.

## 2017-12-03 ENCOUNTER — Ambulatory Visit (INDEPENDENT_AMBULATORY_CARE_PROVIDER_SITE_OTHER): Payer: Medicare Other | Admitting: Family Medicine

## 2017-12-03 ENCOUNTER — Encounter: Payer: Self-pay | Admitting: Family Medicine

## 2017-12-03 VITALS — BP 142/94 | HR 76 | Ht 63.0 in | Wt 211.0 lb

## 2017-12-03 DIAGNOSIS — M1711 Unilateral primary osteoarthritis, right knee: Secondary | ICD-10-CM | POA: Diagnosis not present

## 2017-12-03 NOTE — Progress Notes (Signed)
PCP: Leamon Arnt, MD  Subjective:   HPI: Patient is a 72 y.o. female here for right knee pain.  10/12: Patient reports she's had longstanding history of right knee pain and arthritis. Dates back to at least 2012. Pain is up to 6-7/10 now, sharp, anterior. Worse with walking. Pain also worse with stairs. No benefit with cortisone injections but supartz worked in the past for several months to years. Tried oral nsaid, diclofenac gel (gave her a rash). Used to use arnicare and topical hyaluronic acid. No skin changes, numbness.  10/25: Patient returns to start orthovisc series. Pain up to 8/10 now, more painful this week. No skin changes, numbness.  11/8: Patient is doing well - feels about 60-75% improvement following last shot. She has had some bruising at shot location. No numbness, other complaints.  12/10/16: Patient reports she's doing well. Pain level 0/10. No skin changes, numbness.  11/26/17: Patient reports she's done well with viscosupplementation until about a month ago. Pain anterior right knee 3/10 but up to 8/10 and sharp with walking. No skin changes, numbness.  11/8: Patient reports she's doing well - had a lot of pain over the weekend but resolved by Monday and now pain 0/10. No skin changes, numbness.  Past Medical History:  Diagnosis Date  . Allergy   . Arthritis   . Colon polyps   . Diabetes mellitus   . Fatty infiltration of liver 01/21/2017   By CT scan 2018  . GERD (gastroesophageal reflux disease) 08/25/2012  . Hypertension   . Solitary pulmonary nodule on lung CT 12/25/2016   02/2016 - incidental finding; surveillance CT pending  . Thyroid disease   . Uterine fibroid     Current Outpatient Medications on File Prior to Visit  Medication Sig Dispense Refill  . aspirin 81 MG tablet Take 81 mg by mouth daily.    Marland Kitchen azelastine (ASTELIN) 0.1 % nasal spray INSTILL 1 SPRAY INTO EACH NOSTRIL TWICE A DAY 30 mL 5  . BD PEN NEEDLE NANO U/F 32G  X 4 MM MISC See admin instructions.  0  . Cholecalciferol (VITAMIN D-3 PO) Take by mouth daily.    Marland Kitchen DIVIGEL 0.5 MG/0.5GM GEL PLACE 0.5MG  ON SKIN ONCE DAILY 30 each 11  . fluticasone (FLONASE) 50 MCG/ACT nasal spray Place 1 spray into both nostrils daily. 16 g 0  . glipiZIDE (GLUCOTROL XL) 5 MG 24 hr tablet Take 5 mg by mouth daily with breakfast.    . levothyroxine (SYNTHROID, LEVOTHROID) 150 MCG tablet Take 150 mcg by mouth daily before breakfast.    . liraglutide (VICTOZA) 18 MG/3ML SOPN Inject 1.8 mg into the skin daily.    Marland Kitchen lisinopril-hydrochlorothiazide (PRINZIDE,ZESTORETIC) 10-12.5 MG tablet Take 1 tablet by mouth daily. 90 tablet 3  . metFORMIN (GLUCOPHAGE) 1000 MG tablet Take 1,000 mg by mouth 2 (two) times daily with a meal.    . OZEMPIC, 0.25 OR 0.5 MG/DOSE, 2 MG/1.5ML SOPN   4  . pioglitazone (ACTOS) 15 MG tablet Take 15 mg by mouth daily.    . Triprolidine-Pseudoephedrine (ACTIFED PO) Take 1 tablet by mouth daily.     No current facility-administered medications on file prior to visit.     Past Surgical History:  Procedure Laterality Date  . carpal tunnel  about 2005   right, Dr Fredna Dow  . CATARACT EXTRACTION, BILATERAL    . DILATION AND CURETTAGE OF UTERUS    . thumb surgury  2003   dr Fredna Dow    Allergies  Allergen Reactions  . Voltaren  [Diclofenac Sodium] Rash    Only the topical   . Sulfa Antibiotics Rash    Social History   Socioeconomic History  . Marital status: Divorced    Spouse name: Not on file  . Number of children: Not on file  . Years of education: 76  . Highest education level: Not on file  Occupational History  . 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 Financial resource strain: Not on file  . Food insecurity:    Worry: Not on file    Inability: Not on file  . Transportation needs:    Medical: Not on file    Non-medical: Not on file  Tobacco Use  . Smoking status: Former Research scientist (life sciences)  . Smokeless tobacco: Never  Used  Substance and Sexual Activity  . Alcohol use: Yes  . Drug use: No  . Sexual activity: Not Currently  Lifestyle  . Physical activity:    Days per week: Not on file    Minutes per session: Not on file  . Stress: Not on file  Relationships  . Social connections:    Talks on phone: Not on file    Gets together: Not on file    Attends religious service: Not on file    Active member of club or organization: Not on file    Attends meetings of clubs or organizations: Not on file    Relationship status: Not on file  . Intimate partner violence:    Fear of current or ex partner: Not on file    Emotionally abused: Not on file    Physically abused: Not on file    Forced sexual activity: Not on file  Other Topics Concern  . Not on file  Social History Narrative  . Not on file    Family History  Problem Relation Age of Onset  . Arthritis Other   . Cancer Other        colon, ovarian, breast and prostate cancer  . Heart disease Other   . Hypertension Other   . Diabetes Other   . Cancer Mother        Breast Cancer  . Endometrial cancer Mother   . Arthritis Father   . Cancer Father        Basal Cell Carcinoma  . Diabetes Father   . Heart failure Father   . Hypertension Father   . Lymphoma Sister   . Thyroid disease Daughter   . Cancer Son        Basal Cell Carcinoma    BP (!) 142/94   Pulse 76   Ht 5\' 3"  (1.6 m)   Wt 211 lb (95.7 kg)   BMI 37.38 kg/m   Review of Systems: See HPI above.     Objective:  Physical Exam:  Gen: NAD, comfortable in exam room  Rest of exam not repeated. Right knee: No gross deformity, ecchymoses, swelling. TTP medial > lateral joint lines. ROM 0 - 90 degrees, 5/5 strength flexion and extension. Negative ant/post drawers. Negative valgus/varus testing. NV intact distally.   Assessment & Plan:  1. 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  Did well with viscosupplementation.  Second injection given today - f/u in 1 week for third injection.   Tylenol, topical medications, supplements, aleve if needed.   After informed written consent timeout was performed, patient was seated on exam table. Right knee was prepped with alcohol swab and utilizing anteromedial  approach, patient's right knee was injected intraarticularly with 108mL bupivicaine followed by orthovisc. Patient tolerated the procedure well without immediate complications.

## 2017-12-10 ENCOUNTER — Encounter: Payer: Self-pay | Admitting: Family Medicine

## 2017-12-10 ENCOUNTER — Ambulatory Visit (INDEPENDENT_AMBULATORY_CARE_PROVIDER_SITE_OTHER): Payer: Medicare Other | Admitting: Family Medicine

## 2017-12-10 DIAGNOSIS — M1711 Unilateral primary osteoarthritis, right knee: Secondary | ICD-10-CM

## 2017-12-11 ENCOUNTER — Encounter: Payer: Self-pay | Admitting: Family Medicine

## 2017-12-11 ENCOUNTER — Other Ambulatory Visit: Payer: Self-pay | Admitting: Family Medicine

## 2017-12-11 NOTE — Progress Notes (Signed)
PCP: Leamon Arnt, MD  Subjective:   HPI: Patient is a 72 y.o. female here for right knee pain.  10/12: Patient reports she's had longstanding history of right knee pain and arthritis. Dates back to at least 2012. Pain is up to 6-7/10 now, sharp, anterior. Worse with walking. Pain also worse with stairs. No benefit with cortisone injections but supartz worked in the past for several months to years. Tried oral nsaid, diclofenac gel (gave her a rash). Used to use arnicare and topical hyaluronic acid. No skin changes, numbness.  10/25: Patient returns to start orthovisc series. Pain up to 8/10 now, more painful this week. No skin changes, numbness.  11/8: Patient is doing well - feels about 60-75% improvement following last shot. She has had some bruising at shot location. No numbness, other complaints.  12/10/16: Patient reports she's doing well. Pain level 0/10. No skin changes, numbness.  11/26/17: Patient reports she's done well with viscosupplementation until about a month ago. Pain anterior right knee 3/10 but up to 8/10 and sharp with walking. No skin changes, numbness.  11/8: Patient reports she's doing well - had a lot of pain over the weekend but resolved by Monday and now pain 0/10. No skin changes, numbness.  11/15: Patient reports she's doing well. Pain level 0/10. No skin changes, numbness.  Past Medical History:  Diagnosis Date  . Allergy   . Arthritis   . Colon polyps   . Diabetes mellitus   . Fatty infiltration of liver 01/21/2017   By CT scan 2018  . GERD (gastroesophageal reflux disease) 08/25/2012  . Hypertension   . Solitary pulmonary nodule on lung CT 12/25/2016   02/2016 - incidental finding; surveillance CT pending  . Thyroid disease   . Uterine fibroid     Current Outpatient Medications on File Prior to Visit  Medication Sig Dispense Refill  . aspirin 81 MG tablet Take 81 mg by mouth daily.    Marland Kitchen azelastine (ASTELIN) 0.1 % nasal  spray INSTILL 1 SPRAY INTO EACH NOSTRIL TWICE A DAY 30 mL 5  . BD PEN NEEDLE NANO U/F 32G X 4 MM MISC See admin instructions.  0  . Cholecalciferol (VITAMIN D-3 PO) Take by mouth daily.    Marland Kitchen DIVIGEL 0.5 MG/0.5GM GEL PLACE 0.5MG  ON SKIN ONCE DAILY 30 each 11  . fluticasone (FLONASE) 50 MCG/ACT nasal spray Place 1 spray into both nostrils daily. 16 g 0  . glipiZIDE (GLUCOTROL XL) 5 MG 24 hr tablet Take 5 mg by mouth daily with breakfast.    . levothyroxine (SYNTHROID, LEVOTHROID) 150 MCG tablet Take 150 mcg by mouth daily before breakfast.    . liraglutide (VICTOZA) 18 MG/3ML SOPN Inject 1.8 mg into the skin daily.    Marland Kitchen lisinopril-hydrochlorothiazide (PRINZIDE,ZESTORETIC) 10-12.5 MG tablet Take 1 tablet by mouth daily. 90 tablet 3  . metFORMIN (GLUCOPHAGE) 1000 MG tablet Take 1,000 mg by mouth 2 (two) times daily with a meal.    . OZEMPIC, 0.25 OR 0.5 MG/DOSE, 2 MG/1.5ML SOPN   4  . pioglitazone (ACTOS) 15 MG tablet Take 15 mg by mouth daily.    . Triprolidine-Pseudoephedrine (ACTIFED PO) Take 1 tablet by mouth daily.     No current facility-administered medications on file prior to visit.     Past Surgical History:  Procedure Laterality Date  . carpal tunnel  about 2005   right, Dr Fredna Dow  . CATARACT EXTRACTION, BILATERAL    . DILATION AND CURETTAGE OF UTERUS    .  thumb surgury  2003   dr Fredna Dow    Allergies  Allergen Reactions  . Voltaren  [Diclofenac Sodium] Rash    Only the topical   . Sulfa Antibiotics Rash    Social History   Socioeconomic History  . Marital status: Divorced    Spouse name: Not on file  . Number of children: Not on file  . Years of education: 65  . Highest education level: Not on file  Occupational History  . 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 Financial resource strain: Not on file  . Food insecurity:    Worry: Not on file    Inability: Not on file  . Transportation needs:    Medical: Not on file    Non-medical:  Not on file  Tobacco Use  . Smoking status: Former Research scientist (life sciences)  . Smokeless tobacco: Never Used  Substance and Sexual Activity  . Alcohol use: Yes  . Drug use: No  . Sexual activity: Not Currently  Lifestyle  . Physical activity:    Days per week: Not on file    Minutes per session: Not on file  . Stress: Not on file  Relationships  . Social connections:    Talks on phone: Not on file    Gets together: Not on file    Attends religious service: Not on file    Active member of club or organization: Not on file    Attends meetings of clubs or organizations: Not on file    Relationship status: Not on file  . Intimate partner violence:    Fear of current or ex partner: Not on file    Emotionally abused: Not on file    Physically abused: Not on file    Forced sexual activity: Not on file  Other Topics Concern  . Not on file  Social History Narrative  . Not on file    Family History  Problem Relation Age of Onset  . Arthritis Other   . Cancer Other        colon, ovarian, breast and prostate cancer  . Heart disease Other   . Hypertension Other   . Diabetes Other   . Cancer Mother        Breast Cancer  . Endometrial cancer Mother   . Arthritis Father   . Cancer Father        Basal Cell Carcinoma  . Diabetes Father   . Heart failure Father   . Hypertension Father   . Lymphoma Sister   . Thyroid disease Daughter   . Cancer Son        Basal Cell Carcinoma    BP 135/80   Pulse 73   Ht 5\' 3"  (1.6 m)   Wt 210 lb (95.3 kg)   BMI 37.20 kg/m   Review of Systems: See HPI above.     Objective:  Physical Exam:  Gen: NAD, comfortable in exam room  Rest of exam not repeated. Right knee: No gross deformity, ecchymoses, swelling. TTP medial > lateral joint lines. ROM 0 - 90 degrees, 5/5 strength flexion and extension. Negative ant/post drawers. Negative valgus/varus testing. NV intact distally.   Assessment & Plan:  1. 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  Did well with  viscosupplementation.  Third injection given today.  Will wait on the fourth injection as she's doing well - will call us if worsening again to go ahead with this.  Tylenol, topical medications, supplements, aleve  if needed.  After informed written consent timeout was performed, patient was seated on exam table. Right knee was prepped with alcohol swab and utilizing anteromedial approach, patient's right knee was injected intraarticularly with 70mL bupivicaine followed by orthovisc. Patient tolerated the procedure well without immediate complications.

## 2017-12-13 ENCOUNTER — Other Ambulatory Visit: Payer: Self-pay | Admitting: Family Medicine

## 2017-12-31 ENCOUNTER — Encounter: Payer: Self-pay | Admitting: Family Medicine

## 2017-12-31 ENCOUNTER — Ambulatory Visit (INDEPENDENT_AMBULATORY_CARE_PROVIDER_SITE_OTHER): Payer: Medicare Other | Admitting: Family Medicine

## 2017-12-31 VITALS — BP 123/72 | HR 74 | Ht 63.0 in | Wt 208.0 lb

## 2017-12-31 DIAGNOSIS — M1711 Unilateral primary osteoarthritis, right knee: Secondary | ICD-10-CM

## 2017-12-31 DIAGNOSIS — E039 Hypothyroidism, unspecified: Secondary | ICD-10-CM | POA: Diagnosis not present

## 2017-12-31 NOTE — Progress Notes (Signed)
PCP: Leamon Arnt, MD  Subjective:   HPI: Patient is a 72 y.o. female here for right knee pain.  10/12: Patient reports she's had longstanding history of right knee pain and arthritis. Dates back to at least 2012. Pain is up to 6-7/10 now, sharp, anterior. Worse with walking. Pain also worse with stairs. No benefit with cortisone injections but supartz worked in the past for several months to years. Tried oral nsaid, diclofenac gel (gave her a rash). Used to use arnicare and topical hyaluronic acid. No skin changes, numbness.  10/25: Patient returns to start orthovisc series. Pain up to 8/10 now, more painful this week. No skin changes, numbness.  11/8: Patient is doing well - feels about 60-75% improvement following last shot. She has had some bruising at shot location. No numbness, other complaints.  12/10/16: Patient reports she's doing well. Pain level 0/10. No skin changes, numbness.  11/26/17: Patient reports she's done well with viscosupplementation until about a month ago. Pain anterior right knee 3/10 but up to 8/10 and sharp with walking. No skin changes, numbness.  11/8: Patient reports she's doing well - had a lot of pain over the weekend but resolved by Monday and now pain 0/10. No skin changes, numbness.  11/15: Patient reports she's doing well. Pain level 0/10. No skin changes, numbness.  12/6: Patient returns for fourth orthovisc injection. Pain level 3/10 at worst. No skin changes.  Past Medical History:  Diagnosis Date  . Allergy   . Arthritis   . Colon polyps   . Diabetes mellitus   . Fatty infiltration of liver 01/21/2017   By CT scan 2018  . GERD (gastroesophageal reflux disease) 08/25/2012  . Hypertension   . Solitary pulmonary nodule on lung CT 12/25/2016   02/2016 - incidental finding; surveillance CT pending  . Thyroid disease   . Uterine fibroid     Current Outpatient Medications on File Prior to Visit  Medication Sig  Dispense Refill  . aspirin 81 MG tablet Take 81 mg by mouth daily.    Marland Kitchen azelastine (ASTELIN) 0.1 % nasal spray INSTILL 1 SPRAY INTO EACH NOSTRIL TWICE A DAY 30 mL 5  . BD PEN NEEDLE NANO U/F 32G X 4 MM MISC See admin instructions.  0  . Cholecalciferol (VITAMIN D-3 PO) Take by mouth daily.    Marland Kitchen DIVIGEL 0.5 MG/0.5GM GEL PLACE 0.5MG  ON SKIN ONCE DAILY 30 each 11  . fluticasone (FLONASE) 50 MCG/ACT nasal spray Place 1 spray into both nostrils daily. 16 g 0  . glipiZIDE (GLUCOTROL XL) 5 MG 24 hr tablet Take 5 mg by mouth daily with breakfast.    . levothyroxine (SYNTHROID, LEVOTHROID) 150 MCG tablet Take 150 mcg by mouth daily before breakfast.    . liraglutide (VICTOZA) 18 MG/3ML SOPN Inject 1.8 mg into the skin daily.    Marland Kitchen lisinopril-hydrochlorothiazide (PRINZIDE,ZESTORETIC) 10-12.5 MG tablet Take 1 tablet by mouth daily. Please schedule appointment for blood pressure follow up for more refills 30 tablet 0  . metFORMIN (GLUCOPHAGE) 1000 MG tablet Take 1,000 mg by mouth 2 (two) times daily with a meal.    . OZEMPIC, 0.25 OR 0.5 MG/DOSE, 2 MG/1.5ML SOPN   4  . pioglitazone (ACTOS) 15 MG tablet Take 15 mg by mouth daily.    . Triprolidine-Pseudoephedrine (ACTIFED PO) Take 1 tablet by mouth daily.     No current facility-administered medications on file prior to visit.     Past Surgical History:  Procedure Laterality Date  .  carpal tunnel  about 2005   right, Dr Fredna Dow  . CATARACT EXTRACTION, BILATERAL    . DILATION AND CURETTAGE OF UTERUS    . thumb surgury  2003   dr Fredna Dow    Allergies  Allergen Reactions  . Voltaren  [Diclofenac Sodium] Rash    Only the topical   . Sulfa Antibiotics Rash    Social History   Socioeconomic History  . Marital status: Divorced    Spouse name: Not on file  . Number of children: Not on file  . Years of education: 28  . Highest education level: Not on file  Occupational History  . 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 Financial resource strain: Not on file  . Food insecurity:    Worry: Not on file    Inability: Not on file  . Transportation needs:    Medical: Not on file    Non-medical: Not on file  Tobacco Use  . Smoking status: Former Research scientist (life sciences)  . Smokeless tobacco: Never Used  Substance and Sexual Activity  . Alcohol use: Yes  . Drug use: No  . Sexual activity: Not Currently  Lifestyle  . Physical activity:    Days per week: Not on file    Minutes per session: Not on file  . Stress: Not on file  Relationships  . Social connections:    Talks on phone: Not on file    Gets together: Not on file    Attends religious service: Not on file    Active member of club or organization: Not on file    Attends meetings of clubs or organizations: Not on file    Relationship status: Not on file  . Intimate partner violence:    Fear of current or ex partner: Not on file    Emotionally abused: Not on file    Physically abused: Not on file    Forced sexual activity: Not on file  Other Topics Concern  . Not on file  Social History Narrative  . Not on file    Family History  Problem Relation Age of Onset  . Arthritis Other   . Cancer Other        colon, ovarian, breast and prostate cancer  . Heart disease Other   . Hypertension Other   . Diabetes Other   . Cancer Mother        Breast Cancer  . Endometrial cancer Mother   . Arthritis Father   . Cancer Father        Basal Cell Carcinoma  . Diabetes Father   . Heart failure Father   . Hypertension Father   . Lymphoma Sister   . Thyroid disease Daughter   . Cancer Son        Basal Cell Carcinoma    BP 123/72   Pulse 74   Ht 5\' 3"  (1.6 m)   Wt 208 lb (94.3 kg)   BMI 36.85 kg/m   Review of Systems: See HPI above.     Objective:  Physical Exam:  Gen: NAD, comfortable in exam room  Rest of exam not repeated. Right knee: No gross deformity, ecchymoses, swelling. TTP medial > lateral joint lines. ROM 0 - 90 degrees, 5/5  strength flexion and extension. Negative ant/post drawers. Negative valgus/varus testing. NV intact distally.   Assessment & Plan:  1. 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  Fourth orthovisc given today.  Tylenol, topical medications, supplements, aleve  if needed.  F/u prn.  After informed written consent timeout was performed, patient was seated on exam table. Right knee was prepped with alcohol swab and utilizing anteromedial approach, patient's right knee was injected intraarticularly with 15mL bupivicaine followed by orthovisc. Patient tolerated the procedure well without immediate complications.

## 2018-01-15 ENCOUNTER — Other Ambulatory Visit: Payer: Self-pay | Admitting: Family Medicine

## 2018-01-27 ENCOUNTER — Other Ambulatory Visit: Payer: Self-pay | Admitting: Family Medicine

## 2018-04-08 ENCOUNTER — Other Ambulatory Visit: Payer: Self-pay | Admitting: Family Medicine

## 2018-04-08 NOTE — Telephone Encounter (Signed)
Last OV 03/23/17 divigel last filled 04/12/17 #30 with 11

## 2018-04-08 NOTE — Telephone Encounter (Signed)
Please inform patient she is due for CPE OV; last OV was > 1 year ago. refilled medication for 30 days. Please ask her to schedule.

## 2018-06-03 DIAGNOSIS — E1129 Type 2 diabetes mellitus with other diabetic kidney complication: Secondary | ICD-10-CM | POA: Diagnosis not present

## 2018-06-03 DIAGNOSIS — E039 Hypothyroidism, unspecified: Secondary | ICD-10-CM | POA: Diagnosis not present

## 2018-06-03 DIAGNOSIS — R809 Proteinuria, unspecified: Secondary | ICD-10-CM | POA: Diagnosis not present

## 2018-06-03 LAB — HEMOGLOBIN A1C: Hemoglobin A1C: 7

## 2018-06-03 LAB — TSH: TSH: 1.62 (ref 0.41–5.90)

## 2018-06-08 ENCOUNTER — Other Ambulatory Visit: Payer: Self-pay | Admitting: Family Medicine

## 2018-06-10 ENCOUNTER — Other Ambulatory Visit: Payer: Self-pay | Admitting: *Deleted

## 2018-06-13 ENCOUNTER — Other Ambulatory Visit: Payer: Self-pay | Admitting: Family Medicine

## 2018-07-11 ENCOUNTER — Encounter: Payer: Self-pay | Admitting: Family Medicine

## 2018-07-11 ENCOUNTER — Ambulatory Visit (INDEPENDENT_AMBULATORY_CARE_PROVIDER_SITE_OTHER): Payer: Medicare Other | Admitting: Family Medicine

## 2018-07-11 ENCOUNTER — Other Ambulatory Visit: Payer: Self-pay

## 2018-07-11 VITALS — BP 130/67 | HR 74 | Ht 64.0 in | Wt 200.0 lb

## 2018-07-11 DIAGNOSIS — M25561 Pain in right knee: Secondary | ICD-10-CM

## 2018-07-11 DIAGNOSIS — M25562 Pain in left knee: Secondary | ICD-10-CM

## 2018-07-11 DIAGNOSIS — G8929 Other chronic pain: Secondary | ICD-10-CM

## 2018-07-11 NOTE — Patient Instructions (Signed)
We will go ahead with orthovisc series - we will call you when we get this in to start it.

## 2018-07-11 NOTE — Progress Notes (Signed)
PCP: Leamon Arnt, MD  Subjective:   HPI: Patient is a 73 y.o. female here for left knee pain.  Patient reports pain in the left knee for the past 2 weeks.  Currently 0/10 but can be 7/10 at times.  Specifically worse with walking and at night.  She denies any specific injury.  She states she knows she has arthritis in the knee but has never been bothersome.  Additionally she has had pain in the right knee.  She feels her knee is slightly swollen.  No erythema or bruising.  No locking or giving out.  No distal numbness or tingling.  She also notes increased pain in the right knee.  She believes her right knee pain is now returning due to compensating for her left knee.  She has a well-known history of arthritis in the right knee and has had very good response to Visco supplementation. No recent injury to the right knee.  Past Medical History:  Diagnosis Date  . Allergy   . Arthritis   . Colon polyps   . Diabetes mellitus   . Fatty infiltration of liver 01/21/2017   By CT scan 2018  . GERD (gastroesophageal reflux disease) 08/25/2012  . Hypertension   . Solitary pulmonary nodule on lung CT 12/25/2016   02/2016 - incidental finding; surveillance CT pending  . Thyroid disease   . Uterine fibroid     Current Outpatient Medications on File Prior to Visit  Medication Sig Dispense Refill  . aspirin 81 MG tablet Take 81 mg by mouth daily.    Marland Kitchen azelastine (ASTELIN) 0.1 % nasal spray INSTILL 1 SPRAY INTO EACH NOSTRIL TWICE A DAY 30 mL 0  . BD PEN NEEDLE NANO U/F 32G X 4 MM MISC See admin instructions.  0  . Cholecalciferol (VITAMIN D-3 PO) Take by mouth daily.    Marland Kitchen DIVIGEL 0.5 MG/0.5GM GEL APPLY 0.5 MG TO THE AFFECTED AREA ON SKIN ONCE DAILY 30 each 0  . fluticasone (FLONASE) 50 MCG/ACT nasal spray Place 1 spray into both nostrils daily. 16 g 0  . glipiZIDE (GLUCOTROL XL) 5 MG 24 hr tablet Take 5 mg by mouth daily with breakfast.    . levothyroxine (SYNTHROID, LEVOTHROID) 150 MCG tablet  Take 150 mcg by mouth daily before breakfast.    . liraglutide (VICTOZA) 18 MG/3ML SOPN Inject 1.8 mg into the skin daily.    Marland Kitchen lisinopril-hydrochlorothiazide (PRINZIDE,ZESTORETIC) 10-12.5 MG tablet Take 1 tablet by mouth daily. Please schedule appointment for blood pressure follow up for more refills 30 tablet 0  . metFORMIN (GLUCOPHAGE) 1000 MG tablet Take 1,000 mg by mouth 2 (two) times daily with a meal.    . OZEMPIC, 0.25 OR 0.5 MG/DOSE, 2 MG/1.5ML SOPN   4  . pioglitazone (ACTOS) 15 MG tablet Take 15 mg by mouth daily.    . Triprolidine-Pseudoephedrine (ACTIFED PO) Take 1 tablet by mouth daily.     No current facility-administered medications on file prior to visit.     Past Surgical History:  Procedure Laterality Date  . carpal tunnel  about 2005   right, Dr Fredna Dow  . CATARACT EXTRACTION, BILATERAL    . DILATION AND CURETTAGE OF UTERUS    . thumb surgury  2003   dr Fredna Dow    Allergies  Allergen Reactions  . Voltaren  [Diclofenac Sodium] Rash    Only the topical   . Sulfa Antibiotics Rash    Social History   Socioeconomic History  . Marital status:  Divorced    Spouse name: Not on file  . Number of children: Not on file  . Years of education: 58  . Highest education level: Not on file  Occupational History  . 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 Financial resource strain: Not on file  . Food insecurity    Worry: Not on file    Inability: Not on file  . Transportation needs    Medical: Not on file    Non-medical: Not on file  Tobacco Use  . Smoking status: Former Research scientist (life sciences)  . Smokeless tobacco: Never Used  Substance and Sexual Activity  . Alcohol use: Yes  . Drug use: No  . Sexual activity: Not Currently  Lifestyle  . Physical activity    Days per week: Not on file    Minutes per session: Not on file  . Stress: Not on file  Relationships  . Social Herbalist on phone: Not on file    Gets together: Not on file     Attends religious service: Not on file    Active member of club or organization: Not on file    Attends meetings of clubs or organizations: Not on file    Relationship status: Not on file  . Intimate partner violence    Fear of current or ex partner: Not on file    Emotionally abused: Not on file    Physically abused: Not on file    Forced sexual activity: Not on file  Other Topics Concern  . Not on file  Social History Narrative  . Not on file    Family History  Problem Relation Age of Onset  . Arthritis Other   . Cancer Other        colon, ovarian, breast and prostate cancer  . Heart disease Other   . Hypertension Other   . Diabetes Other   . Cancer Mother        Breast Cancer  . Endometrial cancer Mother   . Arthritis Father   . Cancer Father        Basal Cell Carcinoma  . Diabetes Father   . Heart failure Father   . Hypertension Father   . Lymphoma Sister   . Thyroid disease Daughter   . Cancer Son        Basal Cell Carcinoma    BP 130/67   Pulse 74   Ht 5\' 4"  (1.626 m)   Wt 200 lb (90.7 kg)   BMI 34.33 kg/m   Review of Systems: See HPI above.     Objective:  Physical Exam:  Gen: awake, alert, NAD, comfortable in exam room Pulm: breathing unlabored  Left knee: - Inspection: No gross deformity.  Trace effusion. No erythema or bruising. Skin intact - Palpation: Tenderness over the medial lateral joint line - ROM: full active ROM with flexion and extension in knee.  No patellofemoral crepitus with passive range of motion - Strength: 5/5 strength - Neuro/vasc: NV intact - Special Tests: - LIGAMENTS: negative Lachman's, no MCL or LCL laxity  -- MENISCUS: negative McMurray's  Right knee: - Inspection: No gross deformity.  No effusion. No erythema or bruising. Skin intact - Palpation: Tenderness over the medial lateral joint line - ROM: full active ROM with flexion and extension in knee.  No patellofemoral crepitus with passive range of motion -  Strength: 5/5 strength - Neuro/vasc: NV intact - Special Tests: - LIGAMENTS: negative Lachman's,  no MCL or LCL laxity  -- MENISCUS: negative McMurray's  Assessment & Plan:  1.  Left knee pain secondary to arthritis-no ligamentous or meniscal findings on exam. - Patient desires to the gel shots since she had such good relief with the right knee. - We will call patient to follow-up when shots available.  2. Right knee pain - 2/2 arthritis. Pt would also like to do gel shots in the right knee again now that her pain is returning.

## 2018-07-12 ENCOUNTER — Encounter: Payer: Self-pay | Admitting: Family Medicine

## 2018-07-12 NOTE — Progress Notes (Signed)
Pt was last here over a year ago.  Please call to see if still will be seeing me: Needs OV now: f/u HTN and also needs CPE.  Thanks.

## 2018-07-25 ENCOUNTER — Ambulatory Visit: Payer: Medicare Other | Admitting: Family Medicine

## 2018-07-26 ENCOUNTER — Ambulatory Visit: Payer: Self-pay

## 2018-07-26 ENCOUNTER — Ambulatory Visit (INDEPENDENT_AMBULATORY_CARE_PROVIDER_SITE_OTHER): Payer: Medicare Other | Admitting: Family Medicine

## 2018-07-26 ENCOUNTER — Other Ambulatory Visit: Payer: Self-pay

## 2018-07-26 DIAGNOSIS — M25561 Pain in right knee: Secondary | ICD-10-CM

## 2018-07-26 DIAGNOSIS — M25562 Pain in left knee: Secondary | ICD-10-CM

## 2018-07-26 DIAGNOSIS — M170 Bilateral primary osteoarthritis of knee: Secondary | ICD-10-CM | POA: Diagnosis not present

## 2018-07-26 NOTE — Patient Instructions (Signed)
Nice to meet you Please try ice.  Please try compression  Please try the rub on medicine.  Please send me a message in MyChart with any questions or updates.  Please see me back in 1 week.   --Dr. Raeford Razor

## 2018-07-26 NOTE — Assessment & Plan Note (Signed)
Started her series of orthovisc today. Has received and had good response to gel injections in the past.  - bilateral injections today  - counseled on supportive care - f/u in one week.

## 2018-07-26 NOTE — Progress Notes (Signed)
CEASIA ELWELL - 73 y.o. female MRN [id number]  Date of birth: 06/28/1945  SUBJECTIVE:  Including CC & ROS.  No chief complaint on file.   HIYAB NHEM is a 73 y.o. female that is presenting for gel injections.  She has acute on chronic knee pain is has received gel injections in the past.   Review of Systems  HISTORY: Past Medical, Surgical, Social, and Family History Reviewed & Updated per EMR.   Pertinent Historical Findings include:  Past Medical History:  Diagnosis Date  . Allergy   . Arthritis   . Colon polyps   . Diabetes mellitus   . Fatty infiltration of liver 01/21/2017   By CT scan 2018  . GERD (gastroesophageal reflux disease) 08/25/2012  . Hypertension   . Solitary pulmonary nodule on lung CT 12/25/2016   02/2016 - incidental finding; surveillance CT pending  . Thyroid disease   . Uterine fibroid     Past Surgical History:  Procedure Laterality Date  . carpal tunnel  about 2005   right, Dr Fredna Dow  . CATARACT EXTRACTION, BILATERAL    . DILATION AND CURETTAGE OF UTERUS    . thumb surgury  2003   dr Fredna Dow    Allergies  Allergen Reactions  . Voltaren  [Diclofenac Sodium] Rash    Only the topical   . Sulfa Antibiotics Rash    Family History  Problem Relation Age of Onset  . Arthritis Other   . Cancer Other        colon, ovarian, breast and prostate cancer  . Heart disease Other   . Hypertension Other   . Diabetes Other   . Cancer Mother        Breast Cancer  . Endometrial cancer Mother   . Arthritis Father   . Cancer Father        Basal Cell Carcinoma  . Diabetes Father   . Heart failure Father   . Hypertension Father   . Lymphoma Sister   . Thyroid disease Daughter   . Cancer Son        Basal Cell Carcinoma     Social History   Socioeconomic History  . Marital status: Divorced    Spouse name: Not on file  . Number of children: Not on file  . Years of education: 25  . Highest education level: Not on file  Occupational History  .  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 Financial resource strain: Not on file  . Food insecurity    Worry: Not on file    Inability: Not on file  . Transportation needs    Medical: Not on file    Non-medical: Not on file  Tobacco Use  . Smoking status: Former Research scientist (life sciences)  . Smokeless tobacco: Never Used  Substance and Sexual Activity  . Alcohol use: Yes  . Drug use: No  . Sexual activity: Not Currently  Lifestyle  . Physical activity    Days per week: Not on file    Minutes per session: Not on file  . Stress: Not on file  Relationships  . Social Herbalist on phone: Not on file    Gets together: Not on file    Attends religious service: Not on file    Active member of club or organization: Not on file    Attends meetings of clubs or organizations: Not on file    Relationship status: Not on file  .  Intimate partner violence    Fear of current or ex partner: Not on file    Emotionally abused: Not on file    Physically abused: Not on file    Forced sexual activity: Not on file  Other Topics Concern  . Not on file  Social History Narrative  . Not on file     PHYSICAL EXAM:  VS: There were no vitals taken for this visit. Physical Exam Gen: NAD, alert, cooperative with exam, well-appearing    Aspiration/Injection Procedure Note ADIRA LIMBURG 04/14/1945  Procedure: Injection Indications: right knee pain   Procedure Details Consent: Risks of procedure as well as the alternatives and risks of each were explained to the (patient/caregiver).  Consent for procedure obtained. Time Out: Verified patient identification, verified procedure, site/side was marked, verified correct patient position, special equipment/implants available, medications/allergies/relevent history reviewed, required imaging and test results available.  Performed.  The area was cleaned with iodine and alcohol swabs.    The right knee superior lateral suprapatellar  pouch was injected using 4 cc's of 1% lidocaine with a 21 2" needle.  The syringe was switched and a 2 mL 15 mg/mL of Orthovisc was injected. Ultrasound was used. Images were obtained in  Long views showing the injection.  A sterile dressing was applied.  Patient did tolerate procedure well.   Aspiration/Injection Procedure Note BROOKELYNNE DIMPERIO 10-21-1945  Procedure: Injection Indications: left knee pain   Procedure Details Consent: Risks of procedure as well as the alternatives and risks of each were explained to the (patient/caregiver).  Consent for procedure obtained. Time Out: Verified patient identification, verified procedure, site/side was marked, verified correct patient position, special equipment/implants available, medications/allergies/relevent history reviewed, required imaging and test results available.  Performed.  The area was cleaned with iodine and alcohol swabs.    The left knee superior lateral suprapatellar pouch was injected using 4 cc's of 1% lidocaine with a 21 2" needle.  The syringe was switched and a 2 mL 15 mg/mL of Orthovisc was injected. Ultrasound was used. Images were obtained in  Long views showing the injection.  A sterile dressing was applied.  Patient did tolerate procedure well.    ASSESSMENT & PLAN:   Bilateral primary osteoarthritis of knee Started her series of orthovisc today. Has received and had good response to gel injections in the past.  - bilateral injections today  - counseled on supportive care - f/u in one week.

## 2018-08-01 ENCOUNTER — Other Ambulatory Visit: Payer: Self-pay

## 2018-08-01 ENCOUNTER — Ambulatory Visit (INDEPENDENT_AMBULATORY_CARE_PROVIDER_SITE_OTHER): Payer: Medicare Other | Admitting: Family Medicine

## 2018-08-01 DIAGNOSIS — M170 Bilateral primary osteoarthritis of knee: Secondary | ICD-10-CM | POA: Diagnosis not present

## 2018-08-02 ENCOUNTER — Encounter: Payer: Self-pay | Admitting: Family Medicine

## 2018-08-02 NOTE — Progress Notes (Signed)
PCP: Leamon Arnt, MD  Subjective:   HPI: Patient is a 73 y.o. female here for second orthovisc injections.  6/15: Patient reports pain in the left knee for the past 2 weeks.  Currently 0/10 but can be 7/10 at times.  Specifically worse with walking and at night.  She denies any specific injury.  She states she knows she has arthritis in the knee but has never been bothersome.  Additionally she has had pain in the right knee.  She feels her knee is slightly swollen.  No erythema or bruising.  No locking or giving out.  No distal numbness or tingling.  She also notes increased pain in the right knee.  She believes her right knee pain is now returning due to compensating for her left knee.  She has a well-known history of arthritis in the right knee and has had very good response to Visco supplementation. No recent injury to the right knee.  6/30: Started orthovisc series to both knees.  7/6: Here for second orthovisc injections. No skin changes.  Past Medical History:  Diagnosis Date  . Allergy   . Arthritis   . Colon polyps   . Diabetes mellitus   . Fatty infiltration of liver 01/21/2017   By CT scan 2018  . GERD (gastroesophageal reflux disease) 08/25/2012  . Hypertension   . Solitary pulmonary nodule on lung CT 12/25/2016   02/2016 - incidental finding; surveillance CT pending  . Thyroid disease   . Uterine fibroid     Current Outpatient Medications on File Prior to Visit  Medication Sig Dispense Refill  . aspirin 81 MG tablet Take 81 mg by mouth daily.    Marland Kitchen azelastine (ASTELIN) 0.1 % nasal spray INSTILL 1 SPRAY INTO EACH NOSTRIL TWICE A DAY 30 mL 0  . BD PEN NEEDLE NANO U/F 32G X 4 MM MISC See admin instructions.  0  . Cholecalciferol (VITAMIN D-3 PO) Take by mouth daily.    Marland Kitchen DIVIGEL 0.5 MG/0.5GM GEL APPLY 0.5 MG TO THE AFFECTED AREA ON SKIN ONCE DAILY 30 each 0  . fluticasone (FLONASE) 50 MCG/ACT nasal spray Place 1 spray into both nostrils daily. 16 g 0  . glipiZIDE  (GLUCOTROL XL) 5 MG 24 hr tablet Take 5 mg by mouth daily with breakfast.    . levothyroxine (SYNTHROID, LEVOTHROID) 150 MCG tablet Take 150 mcg by mouth daily before breakfast.    . liraglutide (VICTOZA) 18 MG/3ML SOPN Inject 1.8 mg into the skin daily.    Marland Kitchen lisinopril-hydrochlorothiazide (PRINZIDE,ZESTORETIC) 10-12.5 MG tablet Take 1 tablet by mouth daily. Please schedule appointment for blood pressure follow up for more refills 30 tablet 0  . metFORMIN (GLUCOPHAGE) 1000 MG tablet Take 1,000 mg by mouth 2 (two) times daily with a meal.    . OZEMPIC, 0.25 OR 0.5 MG/DOSE, 2 MG/1.5ML SOPN   4  . pioglitazone (ACTOS) 15 MG tablet Take 15 mg by mouth daily.     No current facility-administered medications on file prior to visit.     Past Surgical History:  Procedure Laterality Date  . carpal tunnel  about 2005   right, Dr Fredna Dow  . CATARACT EXTRACTION, BILATERAL    . DILATION AND CURETTAGE OF UTERUS    . thumb surgury  2003   dr Fredna Dow    Allergies  Allergen Reactions  . Voltaren  [Diclofenac Sodium] Rash    Only the topical   . Sulfa Antibiotics Rash    Social History  Socioeconomic History  . Marital status: Divorced    Spouse name: Not on file  . Number of children: Not on file  . Years of education: 60  . Highest education level: Not on file  Occupational History  . 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 Financial resource strain: Not on file  . Food insecurity    Worry: Not on file    Inability: Not on file  . Transportation needs    Medical: Not on file    Non-medical: Not on file  Tobacco Use  . Smoking status: Former Research scientist (life sciences)  . Smokeless tobacco: Never Used  Substance and Sexual Activity  . Alcohol use: Yes  . Drug use: No  . Sexual activity: Not Currently  Lifestyle  . Physical activity    Days per week: Not on file    Minutes per session: Not on file  . Stress: Not on file  Relationships  . Social Herbalist on  phone: Not on file    Gets together: Not on file    Attends religious service: Not on file    Active member of club or organization: Not on file    Attends meetings of clubs or organizations: Not on file    Relationship status: Not on file  . Intimate partner violence    Fear of current or ex partner: Not on file    Emotionally abused: Not on file    Physically abused: Not on file    Forced sexual activity: Not on file  Other Topics Concern  . Not on file  Social History Narrative  . Not on file    Family History  Problem Relation Age of Onset  . Arthritis Other   . Cancer Other        colon, ovarian, breast and prostate cancer  . Heart disease Other   . Hypertension Other   . Diabetes Other   . Cancer Mother        Breast Cancer  . Endometrial cancer Mother   . Arthritis Father   . Cancer Father        Basal Cell Carcinoma  . Diabetes Father   . Heart failure Father   . Hypertension Father   . Lymphoma Sister   . Thyroid disease Daughter   . Cancer Son        Basal Cell Carcinoma    BP (!) 144/78   Ht 5' 3.5" (1.613 m)   Wt 200 lb (90.7 kg)   BMI 34.87 kg/m   Review of Systems: See HPI above.     Objective:  Physical Exam:  Gen: NAD, comfortable in exam room  Knee exam not repeated today   Assessment & Plan:  1. Bilateral knee arthritis - second orthovisc injections given today.  F/u in 1 week for third injections.  After informed written consent timeout was performed, patient was seated on exam table. Right knee was prepped with alcohol swab and utilizing anteromedial approach, patient's right knee was injected intraarticularly with 73mL bupivicaine followed by orthovisc. Patient tolerated the procedure well without immediate complications.  After informed written consent timeout was performed, patient was seated on exam table. Left knee was prepped with alcohol swab and utilizing anteromedial approach, patient's left knee was injected intraarticularly  with 69mL bupivicaine followed by orthovisc. Patient tolerated the procedure well without immediate complications.

## 2018-08-08 ENCOUNTER — Other Ambulatory Visit: Payer: Self-pay

## 2018-08-08 ENCOUNTER — Ambulatory Visit (INDEPENDENT_AMBULATORY_CARE_PROVIDER_SITE_OTHER): Payer: Medicare Other | Admitting: Family Medicine

## 2018-08-08 DIAGNOSIS — M170 Bilateral primary osteoarthritis of knee: Secondary | ICD-10-CM

## 2018-08-09 ENCOUNTER — Encounter: Payer: Self-pay | Admitting: Family Medicine

## 2018-08-09 ENCOUNTER — Other Ambulatory Visit: Payer: Self-pay | Admitting: Family Medicine

## 2018-08-09 NOTE — Progress Notes (Signed)
PCP: Leamon Arnt, MD  Subjective:   HPI: Patient is a 73 y.o. female here for second orthovisc injections.  6/15: Patient reports pain in the left knee for the past 2 weeks.  Currently 0/10 but can be 7/10 at times.  Specifically worse with walking and at night.  She denies any specific injury.  She states she knows she has arthritis in the knee but has never been bothersome.  Additionally she has had pain in the right knee.  She feels her knee is slightly swollen.  No erythema or bruising.  No locking or giving out.  No distal numbness or tingling.  She also notes increased pain in the right knee.  She believes her right knee pain is now returning due to compensating for her left knee.  She has a well-known history of arthritis in the right knee and has had very good response to Visco supplementation. No recent injury to the right knee.  6/30: Started orthovisc series to both knees.  7/6: Here for second orthovisc injections. No skin changes.  7/13: Patient reports she's doing ok - thinks she wants to go ahead with fourth injections as left knee is still bothering her quite a bit - getting third injections today. No skin changes.  Past Medical History:  Diagnosis Date  . Allergy   . Arthritis   . Colon polyps   . Diabetes mellitus   . Fatty infiltration of liver 01/21/2017   By CT scan 2018  . GERD (gastroesophageal reflux disease) 08/25/2012  . Hypertension   . Solitary pulmonary nodule on lung CT 12/25/2016   02/2016 - incidental finding; surveillance CT pending  . Thyroid disease   . Uterine fibroid     Current Outpatient Medications on File Prior to Visit  Medication Sig Dispense Refill  . aspirin 81 MG tablet Take 81 mg by mouth daily.    Marland Kitchen azelastine (ASTELIN) 0.1 % nasal spray INSTILL 1 SPRAY INTO EACH NOSTRIL TWICE A DAY 30 mL 0  . BD PEN NEEDLE NANO U/F 32G X 4 MM MISC See admin instructions.  0  . Cholecalciferol (VITAMIN D-3 PO) Take by mouth daily.    Marland Kitchen  DIVIGEL 0.5 MG/0.5GM GEL APPLY 0.5 MG TO THE AFFECTED AREA ON SKIN ONCE DAILY 30 each 0  . fluticasone (FLONASE) 50 MCG/ACT nasal spray Place 1 spray into both nostrils daily. 16 g 0  . glipiZIDE (GLUCOTROL XL) 5 MG 24 hr tablet Take 5 mg by mouth daily with breakfast.    . levothyroxine (SYNTHROID, LEVOTHROID) 150 MCG tablet Take 150 mcg by mouth daily before breakfast.    . liraglutide (VICTOZA) 18 MG/3ML SOPN Inject 1.8 mg into the skin daily.    Marland Kitchen lisinopril-hydrochlorothiazide (PRINZIDE,ZESTORETIC) 10-12.5 MG tablet Take 1 tablet by mouth daily. Please schedule appointment for blood pressure follow up for more refills 30 tablet 0  . metFORMIN (GLUCOPHAGE) 1000 MG tablet Take 1,000 mg by mouth 2 (two) times daily with a meal.    . OZEMPIC, 0.25 OR 0.5 MG/DOSE, 2 MG/1.5ML SOPN   4  . pioglitazone (ACTOS) 15 MG tablet Take 15 mg by mouth daily.     No current facility-administered medications on file prior to visit.     Past Surgical History:  Procedure Laterality Date  . carpal tunnel  about 2005   right, Dr Fredna Dow  . CATARACT EXTRACTION, BILATERAL    . DILATION AND CURETTAGE OF UTERUS    . thumb surgury  2003  dr Fredna Dow    Allergies  Allergen Reactions  . Voltaren  [Diclofenac Sodium] Rash    Only the topical   . Sulfa Antibiotics Rash    Social History   Socioeconomic History  . Marital status: Divorced    Spouse name: Not on file  . Number of children: Not on file  . Years of education: 51  . Highest education level: Not on file  Occupational History  . 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 Financial resource strain: Not on file  . Food insecurity    Worry: Not on file    Inability: Not on file  . Transportation needs    Medical: Not on file    Non-medical: Not on file  Tobacco Use  . Smoking status: Former Research scientist (life sciences)  . Smokeless tobacco: Never Used  Substance and Sexual Activity  . Alcohol use: Yes  . Drug use: No  . Sexual  activity: Not Currently  Lifestyle  . Physical activity    Days per week: Not on file    Minutes per session: Not on file  . Stress: Not on file  Relationships  . Social Herbalist on phone: Not on file    Gets together: Not on file    Attends religious service: Not on file    Active member of club or organization: Not on file    Attends meetings of clubs or organizations: Not on file    Relationship status: Not on file  . Intimate partner violence    Fear of current or ex partner: Not on file    Emotionally abused: Not on file    Physically abused: Not on file    Forced sexual activity: Not on file  Other Topics Concern  . Not on file  Social History Narrative  . Not on file    Family History  Problem Relation Age of Onset  . Arthritis Other   . Cancer Other        colon, ovarian, breast and prostate cancer  . Heart disease Other   . Hypertension Other   . Diabetes Other   . Cancer Mother        Breast Cancer  . Endometrial cancer Mother   . Arthritis Father   . Cancer Father        Basal Cell Carcinoma  . Diabetes Father   . Heart failure Father   . Hypertension Father   . Lymphoma Sister   . Thyroid disease Daughter   . Cancer Son        Basal Cell Carcinoma    BP 138/70   Ht 5' 3.5" (1.613 m)   Wt 200 lb (90.7 kg)   BMI 34.87 kg/m   Review of Systems: See HPI above.     Objective:  Physical Exam:  Gen: NAD, comfortable in exam room  Knee exam not repeated today   Assessment & Plan:  1. Bilateral knee arthritis - third orthovisc injections given today.  F/u in 1 week tentatively for fourth injections.  After informed written consent timeout was performed, patient was seated on exam table. Right knee was prepped with alcohol swab and utilizing anteromedial approach, patient's right knee was injected intraarticularly with 34mL bupivicaine followed by orthovisc. Patient tolerated the procedure well without immediate complications.  After  informed written consent timeout was performed, patient was seated on exam table. Left knee was prepped with alcohol swab and utilizing anteromedial  approach, patient's left knee was injected intraarticularly with 9mL bupivicaine followed by orthovisc. Patient tolerated the procedure well without immediate complications.

## 2018-08-15 ENCOUNTER — Ambulatory Visit (INDEPENDENT_AMBULATORY_CARE_PROVIDER_SITE_OTHER): Payer: Medicare Other | Admitting: Family Medicine

## 2018-08-15 ENCOUNTER — Other Ambulatory Visit: Payer: Self-pay

## 2018-08-15 DIAGNOSIS — M170 Bilateral primary osteoarthritis of knee: Secondary | ICD-10-CM

## 2018-08-16 ENCOUNTER — Encounter: Payer: Self-pay | Admitting: Family Medicine

## 2018-08-16 ENCOUNTER — Other Ambulatory Visit: Payer: Self-pay | Admitting: Family Medicine

## 2018-08-16 MED ORDER — AZELASTINE HCL 0.1 % NA SOLN: 1.0000 | Freq: Two times a day (BID) | NASAL | 0 refills | Status: DC

## 2018-08-16 NOTE — Progress Notes (Signed)
PCP: Leamon Arnt, MD  Subjective:   HPI: Patient is a 73 y.o. female here for second orthovisc injections.  6/15: Patient reports pain in the left knee for the past 2 weeks.  Currently 0/10 but can be 7/10 at times.  Specifically worse with walking and at night.  She denies any specific injury.  She states she knows she has arthritis in the knee but has never been bothersome.  Additionally she has had pain in the right knee.  She feels her knee is slightly swollen.  No erythema or bruising.  No locking or giving out.  No distal numbness or tingling.  She also notes increased pain in the right knee.  She believes her right knee pain is now returning due to compensating for her left knee.  She has a well-known history of arthritis in the right knee and has had very good response to Visco supplementation. No recent injury to the right knee.  6/30: Started orthovisc series to both knees.  7/6: Here for second orthovisc injections. No skin changes.  7/13: Patient reports she's doing ok - thinks she wants to go ahead with fourth injections as left knee is still bothering her quite a bit - getting third injections today. No skin changes.  7/20: Patient is doing well - right knee improved, left knee still with 4-5/10 pain. No skin changes.  Past Medical History:  Diagnosis Date  . Allergy   . Arthritis   . Colon polyps   . Diabetes mellitus   . Fatty infiltration of liver 01/21/2017   By CT scan 2018  . GERD (gastroesophageal reflux disease) 08/25/2012  . Hypertension   . Solitary pulmonary nodule on lung CT 12/25/2016   02/2016 - incidental finding; surveillance CT pending  . Thyroid disease   . Uterine fibroid     Current Outpatient Medications on File Prior to Visit  Medication Sig Dispense Refill  . aspirin 81 MG tablet Take 81 mg by mouth daily.    . BD PEN NEEDLE NANO U/F 32G X 4 MM MISC See admin instructions.  0  . Cholecalciferol (VITAMIN D-3 PO) Take by mouth daily.     Marland Kitchen DIVIGEL 0.5 MG/0.5GM GEL APPLY 0.5 MG TO THE AFFECTED AREA ON SKIN ONCE DAILY 30 each 0  . fluticasone (FLONASE) 50 MCG/ACT nasal spray Place 1 spray into both nostrils daily. 16 g 0  . glipiZIDE (GLUCOTROL XL) 5 MG 24 hr tablet Take 5 mg by mouth daily with breakfast.    . levothyroxine (SYNTHROID) 137 MCG tablet Take 137 mcg by mouth daily.    Marland Kitchen liraglutide (VICTOZA) 18 MG/3ML SOPN Inject 1.8 mg into the skin daily.    Marland Kitchen lisinopril-hydrochlorothiazide (PRINZIDE,ZESTORETIC) 10-12.5 MG tablet Take 1 tablet by mouth daily. Please schedule appointment for blood pressure follow up for more refills 30 tablet 0  . metFORMIN (GLUCOPHAGE) 1000 MG tablet Take 1,000 mg by mouth 2 (two) times daily with a meal.    . OZEMPIC, 0.25 OR 0.5 MG/DOSE, 2 MG/1.5ML SOPN   4  . pioglitazone (ACTOS) 15 MG tablet Take 15 mg by mouth daily.     No current facility-administered medications on file prior to visit.     Past Surgical History:  Procedure Laterality Date  . carpal tunnel  about 2005   right, Dr Fredna Dow  . CATARACT EXTRACTION, BILATERAL    . DILATION AND CURETTAGE OF UTERUS    . thumb surgury  2003   dr Fredna Dow  Allergies  Allergen Reactions  . Voltaren  [Diclofenac Sodium] Rash    Only the topical   . Sulfa Antibiotics Rash    Social History   Socioeconomic History  . Marital status: Divorced    Spouse name: Not on file  . Number of children: Not on file  . Years of education: 2  . Highest education level: Not on file  Occupational History  . 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 Financial resource strain: Not on file  . Food insecurity    Worry: Not on file    Inability: Not on file  . Transportation needs    Medical: Not on file    Non-medical: Not on file  Tobacco Use  . Smoking status: Former Research scientist (life sciences)  . Smokeless tobacco: Never Used  Substance and Sexual Activity  . Alcohol use: Yes  . Drug use: No  . Sexual activity: Not Currently   Lifestyle  . Physical activity    Days per week: Not on file    Minutes per session: Not on file  . Stress: Not on file  Relationships  . Social Herbalist on phone: Not on file    Gets together: Not on file    Attends religious service: Not on file    Active member of club or organization: Not on file    Attends meetings of clubs or organizations: Not on file    Relationship status: Not on file  . Intimate partner violence    Fear of current or ex partner: Not on file    Emotionally abused: Not on file    Physically abused: Not on file    Forced sexual activity: Not on file  Other Topics Concern  . Not on file  Social History Narrative  . Not on file    Family History  Problem Relation Age of Onset  . Arthritis Other   . Cancer Other        colon, ovarian, breast and prostate cancer  . Heart disease Other   . Hypertension Other   . Diabetes Other   . Cancer Mother        Breast Cancer  . Endometrial cancer Mother   . Arthritis Father   . Cancer Father        Basal Cell Carcinoma  . Diabetes Father   . Heart failure Father   . Hypertension Father   . Lymphoma Sister   . Thyroid disease Daughter   . Cancer Son        Basal Cell Carcinoma    BP 138/80   Review of Systems: See HPI above.     Objective:  Physical Exam:  Gen: NAD, comfortable in exam room  Knee exam not repeated today.   Assessment & Plan:  1. Bilateral knee arthritis - fourth orthovisc injections given today.  F/u as needed.  After informed written consent timeout was performed, patient was seated on exam table. Right knee was prepped with alcohol swab and utilizing anteromedial approach, patient's right knee was injected intraarticularly with 3mL bupivicaine followed by orthovisc. Patient tolerated the procedure well without immediate complications.  After informed written consent timeout was performed, patient was seated on exam table. Left knee was prepped with alcohol swab  and utilizing anteromedial approach, patient's left knee was injected intraarticularly with 62mL bupivicaine followed by orthovisc.  Patient tolerated the procedure well without immediate complications.

## 2018-08-17 ENCOUNTER — Encounter: Payer: Self-pay | Admitting: Family Medicine

## 2018-08-26 ENCOUNTER — Other Ambulatory Visit: Payer: Self-pay

## 2018-08-26 ENCOUNTER — Ambulatory Visit (INDEPENDENT_AMBULATORY_CARE_PROVIDER_SITE_OTHER): Payer: Medicare Other | Admitting: Family Medicine

## 2018-08-26 ENCOUNTER — Encounter: Payer: Self-pay | Admitting: Family Medicine

## 2018-08-26 VITALS — BP 142/78 | HR 80 | Temp 98.0°F | Resp 16 | Ht 67.0 in | Wt 227.0 lb

## 2018-08-26 DIAGNOSIS — E039 Hypothyroidism, unspecified: Secondary | ICD-10-CM | POA: Diagnosis not present

## 2018-08-26 DIAGNOSIS — I10 Essential (primary) hypertension: Secondary | ICD-10-CM

## 2018-08-26 DIAGNOSIS — Z79890 Hormone replacement therapy (postmenopausal): Secondary | ICD-10-CM

## 2018-08-26 DIAGNOSIS — K219 Gastro-esophageal reflux disease without esophagitis: Secondary | ICD-10-CM

## 2018-08-26 DIAGNOSIS — E2839 Other primary ovarian failure: Secondary | ICD-10-CM

## 2018-08-26 DIAGNOSIS — K760 Fatty (change of) liver, not elsewhere classified: Secondary | ICD-10-CM | POA: Diagnosis not present

## 2018-08-26 DIAGNOSIS — Z1159 Encounter for screening for other viral diseases: Secondary | ICD-10-CM | POA: Diagnosis not present

## 2018-08-26 DIAGNOSIS — E1140 Type 2 diabetes mellitus with diabetic neuropathy, unspecified: Secondary | ICD-10-CM

## 2018-08-26 LAB — COMPREHENSIVE METABOLIC PANEL
ALT: 18 U/L (ref 0–35)
AST: 21 U/L (ref 0–37)
Albumin: 4.4 g/dL (ref 3.5–5.2)
Alkaline Phosphatase: 48 U/L (ref 39–117)
BUN: 21 mg/dL (ref 6–23)
CO2: 28 mEq/L (ref 19–32)
Calcium: 10.7 mg/dL — ABNORMAL HIGH (ref 8.4–10.5)
Chloride: 108 mEq/L (ref 96–112)
Creatinine, Ser: 0.66 mg/dL (ref 0.40–1.20)
GFR: 87.9 mL/min (ref >60.00)
Glucose, Bld: 126 mg/dL — ABNORMAL HIGH (ref 70–99)
Potassium: 4.1 mEq/L (ref 3.5–5.1)
Sodium: 144 mEq/L (ref 135–145)
Total Bilirubin: 0.4 mg/dL (ref 0.2–1.2)
Total Protein: 7 g/dL (ref 6.0–8.3)

## 2018-08-26 LAB — CBC WITH DIFFERENTIAL/PLATELET
Basophils Absolute: 0.1 10*3/uL (ref 0.0–0.1)
Basophils Relative: 1.5 % (ref 0.0–3.0)
Eosinophils Absolute: 0.2 10*3/uL (ref 0.0–0.7)
Eosinophils Relative: 3.2 % (ref 0.0–5.0)
HCT: 34.6 % — ABNORMAL LOW (ref 36.0–46.0)
Hemoglobin: 11.6 g/dL — ABNORMAL LOW (ref 12.0–15.0)
Lymphocytes Relative: 30.2 % (ref 12.0–46.0)
Lymphs Abs: 1.5 10*3/uL (ref 0.7–4.0)
MCHC: 33.4 g/dL (ref 30.0–36.0)
MCV: 91.8 fl (ref 78.0–100.0)
Monocytes Absolute: 0.4 10*3/uL (ref 0.1–1.0)
Monocytes Relative: 8.1 % (ref 3.0–12.0)
Neutro Abs: 2.7 10*3/uL (ref 1.4–7.7)
Neutrophils Relative %: 57 % (ref 43.0–77.0)
Platelets: 238 10*3/uL (ref 150.0–400.0)
RBC: 3.77 Mil/uL — ABNORMAL LOW (ref 3.87–5.11)
RDW: 14.9 % (ref 11.5–15.5)
WBC: 4.8 10*3/uL (ref 4.0–10.5)

## 2018-08-26 LAB — LIPID PANEL
Cholesterol: 143 mg/dL (ref 0–200)
HDL: 85.4 mg/dL (ref >39.00)
LDL Cholesterol: 51 mg/dL (ref 0–99)
NonHDL: 57.22
Total CHOL/HDL Ratio: 2
Triglycerides: 33 mg/dL (ref 0.0–149.0)
VLDL: 6.6 mg/dL (ref 0.0–40.0)

## 2018-08-26 LAB — MICROALBUMIN / CREATININE URINE RATIO
Creatinine,U: 84.7 mg/dL
Microalb Creat Ratio: 1.1 mg/g (ref 0.0–30.0)
Microalb, Ur: 0.9 mg/dL (ref 0.0–1.9)

## 2018-08-26 LAB — HEMOGLOBIN A1C: Hgb A1c MFr Bld: 6.4 % (ref 4.6–6.5)

## 2018-08-26 LAB — TSH: TSH: 0.71 u[IU]/mL (ref 0.35–4.50)

## 2018-08-26 MED ORDER — SHINGRIX 50 MCG/0.5ML IM SUSR: 0.5000 mL | Freq: Once | INTRAMUSCULAR | 0 refills | Status: AC

## 2018-08-26 NOTE — Progress Notes (Signed)
Subjective  Chief Complaint  Patient presents with  . Annual Exam    Fasting    HPI: Cynthia Barnett is a 73 y.o. female who presents to [facility] today for a Female Wellness Visit. She also has the concerns and/or needs as listed above in the chief complaint. These will be addressed in addition to the Health Maintenance Visit.   Wellness Visit: annual visit with health maintenance review and exam without Pap   Medicare CPE: Patient says she is doing very well.  He has no new concerns.  Trying to eat healthy.  Bilateral knee arthritis limits exercise.  No concerns with her current medications.  She is due for bone density screening. Chronic disease f/u and/or acute problem visit: (deemed necessary to be done in addition to the wellness visit):  Diabetes type 2: Reviewed recent endocrinology notes.  Adjusting medications accordingly.  She is not on a statin per endocrine's recommendations.  Hypertension is been well controlled.  Continue on ACE inhibitor.  She denies chest pain or shortness of breath.  Thyroid: Normal energy levels.  No symptoms of low or high thyroid.  She is compliant with medications.  GERD: Not currently active  HRT is stable.  Allergic rhinitis improving with Astelin nasal spray  Assessment  1. Controlled type 2 diabetes mellitus with diabetic neuropathy, without long-term current use of insulin ([place])   2. Essential hypertension   3. Acquired hypothyroidism   4. Fatty infiltration of liver   5. Gastroesophageal reflux disease, esophagitis presence not specified   6. Postmenopausal HRT (hormone replacement therapy)   7. Hypoestrogenism   8. Need for hepatitis C screening test      Plan  Female Wellness Visit:  Age appropriate Health Maintenance and Prevention measures were discussed with patient. Included topics are cancer screening recommendations, ways to keep healthy (see AVS) including dietary and exercise  recommendations, regular eye and dental care, use of seat belts, and avoidance of moderate alcohol use and tobacco use.  Mammogram and bone density ordered  BMI: discussed patient's BMI and encouraged positive lifestyle modifications to help get to or maintain a target BMI.  HM needs and immunizations were addressed and ordered. See below for orders. See HM and immunization section for updates.  Discussed Shingrix, prescription given.  Routine labs and screening tests ordered including cmp, cbc and lipids where appropriate.  Discussed recommendations regarding Vit D and calcium supplementation (see AVS)  Chronic disease management visit and/or acute problem visit:  Diabetes and hypertension are fairly well controlled.  Continue current medications and follow-up with endocrinology.  Will increase dose of ACE inhibitor if blood pressures remain mildly elevated.  She reports her fasting sugars are improving.  Obesity: Monitor diet, recommend weight loss  Recheck thyroid levels on supplementation.  Monitoring CBC and liver test. Follow up: Return in about 6 months (around 02/26/2019) for follow up Hypertension.  Orders Placed This Encounter  Procedures  . DG Bone Density  . Comprehensive metabolic panel  . CBC with Differential/Platelet  . Hemoglobin A1c  . TSH  . Microalbumin / creatinine urine ratio  . Hepatitis C antibody  . Lipid panel   Meds ordered this encounter  Medications  . Zoster Vaccine Adjuvanted [facility]) injection    Sig: Inject 0.5 mLs into the muscle once for 1 dose. Please give 2nd dose 2-6 months after first dose    Dispense:  2 each    Refill:  0  Lifestyle: Body mass index is 35.55 kg/m. Wt Readings from Last 3 Encounters:  08/26/18 227 lb (103 kg)  08/08/18 200 lb (90.7 kg)  08/01/18 200 lb (90.7 kg)    Patient Active Problem List   Diagnosis Date Noted  . Postmenopausal HRT (hormone replacement therapy) 03/06/2016    Priority: High  .  Controlled type 2 diabetes mellitus with diabetic neuropathy, without long-term current use of insulin ([place])     Priority: High  . Essential hypertension     Priority: High  . Hypothyroidism     Priority: High  . Bilateral primary osteoarthritis of knee 12/25/2016    Priority: Medium  . Left ear hearing loss 02/10/2012    Priority: Medium  . Colon polyps 06/09/2011    Priority: Medium    Colonoscopy #2 with Dr Medoff/GI - Nov 2012, with f/u at 6 yrs   . GERD (gastroesophageal reflux disease) 08/25/2012    Priority: Low  . Fatty infiltration of liver 01/21/2017    By CT scan 2018   . Solitary pulmonary nodule on lung CT 12/25/2016    02/2016 - incidental finding; surveillance CT 2018 - stable - no further imaging needed. Stable since 2015. Benign.   Marland Kitchen Uterine fibroid    Health Maintenance  Topic Date Due  . Hepatitis C Screening  1945-06-05  . TETANUS/TDAP  02/05/1965  . DEXA SCAN  06/08/2013  . OPHTHALMOLOGY EXAM  03/13/2017  . INFLUENZA VACCINE  08/27/2018  . MAMMOGRAM  11/20/2018  . HEMOGLOBIN A1C  12/04/2018  . FOOT EXAM  08/26/2019  . COLONOSCOPY  11/19/2020  . PNA vac Low Risk Adult  Completed   Immunization History  Administered Date(s) Administered  . Influenza, High Dose Seasonal PF 11/02/2015, 10/15/2016, 11/15/2017  . Influenza-Unspecified 10/27/2010, 10/29/2016  . Pneumococcal Conjugate-13 05/27/2010, 03/28/2013  . Pneumococcal-Unspecified 03/28/2013  . Zoster 06/09/2010   We updated and reviewed the patient's past history in detail and it is documented below. Allergies: Patient is allergic to voltaren  [diclofenac sodium] and sulfa antibiotics. Past Medical History Patient  has a past medical history of Allergy, Arthritis, Colon polyps, Diabetes mellitus, Fatty infiltration of liver (01/21/2017), GERD (gastroesophageal reflux disease) (08/25/2012), Hypertension, Solitary pulmonary nodule on lung CT (12/25/2016), Thyroid disease, and Uterine fibroid. Past  Surgical History Patient  has a past surgical history that includes thumb surgury (2003); carpal tunnel (about 2005); Cataract extraction, bilateral; and Dilation and curettage of uterus. Family History: Patient family history includes Arthritis in her father and another family member; Cancer in her father, mother, son, and another family member; Diabetes in her father and another family member; Endometrial cancer in her mother; Heart disease in an other family member; Heart failure in her father; Hypertension in her father and another family member; Lymphoma in her sister; Thyroid disease in her daughter. Social History:  Patient  reports that she has quit smoking. She has never used smokeless tobacco. She reports current alcohol use. She reports that she does not use drugs.  Review of Systems: Constitutional: negative for fever or malaise Ophthalmic: negative for photophobia, double vision or loss of vision Cardiovascular: negative for chest pain, dyspnea on exertion, or new LE swelling Respiratory: negative for SOB or persistent cough Gastrointestinal: negative for abdominal pain, change in bowel habits or melena Genitourinary: negative for dysuria or gross hematuria, no abnormal uterine bleeding or disharge Musculoskeletal: negative for new gait disturbance or muscular weakness Integumentary: negative for new or persistent rashes, no breast lumps Neurological: negative for TIA  or stroke symptoms Psychiatric: negative for SI or delusions Allergic/Immunologic: negative for hives  Patient Care Team    Relationship Specialty Notifications Start End  Leamon Arnt, MD PCP - General Family Medicine  09/18/16   Dene Gentry, MD Consulting Physician Sports Medicine  12/25/16   Gerome Apley, MD Referring Physician Endocrinology  12/25/16   Dorien Chihuahua, MD Referring Physician Obstetrics and Gynecology  12/25/16   Jola Schmidt, MD Consulting Physician Ophthalmology  12/25/16    Sydnee Levans, MD Consulting Physician Dermatology  12/25/16     Objective  Vitals: BP (!) 142/78   Pulse 80   Temp 98 F (36.7 C) (Oral)   Resp 16   Ht 5\' 7"  (1.702 m)   Wt 227 lb (103 kg)   SpO2 97%   BMI 35.55 kg/m  General:  Well developed, well nourished, no acute distress  Psych:  Alert and orientedx3,normal mood and affect HEENT:  Normocephalic, atraumatic, non-icteric sclera, PERRL, oropharynx is clear without mass or exudate, supple neck without adenopathy, mass or thyromegaly Cardiovascular:  Normal S1, S2, RRR without gallop, rub or murmur, nondisplaced PMI, tr pitting edema bilaterally Respiratory:  Good breath sounds bilaterally, CTAB with normal respiratory effort Gastrointestinal: normal bowel sounds, soft, non-tender, no noted masses. No HSM MSK: no deformities, contusions. Joints are without erythema or swelling. Spine and CVA region are nontender Skin:  Warm, no rashes or suspicious lesions noted Neurologic:    Mental status is normal. CN 2-11 are normal. Gross motor and sensory exams are normal. Normal gait. No tremor Breast Exam: No mass, skin retraction or nipple discharge is appreciated in either breast. No axillary adenopathy. Fibrocystic changes are not noted Diabetic Foot Exam: Appearance - no lesions, ulcers or calluses Skin - no sigificant pallor or erythema Monofilament testing - sensitive bilaterally in following locations:  Right - Great toe, medial, central, lateral ball and posterior foot intact  Left - Great toe, medial, central, lateral ball and posterior foot intact Pulses - +2 distally bilaterally     Commons side effects, risks, benefits, and alternatives for medications and treatment plan prescribed today were discussed, and the patient expressed understanding of the given instructions. Patient is instructed to call or message via MyChart if he/she has any questions or concerns regarding our treatment plan. No barriers to understanding  were identified. We discussed Red Flag symptoms and signs in detail. Patient expressed understanding regarding what to do in case of urgent or emergency type symptoms.   Medication list was reconciled, printed and provided to the patient in AVS. Patient instructions and summary information was reviewed with the patient as documented in the AVS. This note was prepared with assistance of Dragon voice recognition software. Occasional wrong-word or sound-a-like substitutions may have occurred due to the inherent limitations of voice recognition software

## 2018-08-26 NOTE — Patient Instructions (Addendum)
Please return in 6 months for follow up of your hypertension.  I will release your lab results to you on your MyChart account with further instructions. Please reply with any questions.   We will call you with information regarding your referral appointment. Bone density screening.This will be set up for you to do at [facility] with your next mammogram.  Please take the RX for shingrix to the pharmacy to get the vaccination.     If you have any questions or concerns, please don't hesitate to send me a message via MyChart or call the office at [phone]. Thank you for visiting with Cynthia Barnett today! It's our pleasure caring for you.   Preventive Care 72 Years and Older, Female Preventive care refers to lifestyle choices and visits with your health care provider that can promote health and wellness. This includes:  A yearly physical exam. This is also called an annual well check.  Regular dental and eye exams.  Immunizations.  Screening for certain conditions.  Healthy lifestyle choices, such as diet and exercise. What can I expect for my preventive care visit? Physical exam Your health care provider will check:  Height and weight. These may be used to calculate body mass index (BMI), which is a measurement that tells if you are at a healthy weight.  Heart rate and blood pressure.  Your skin for abnormal spots. Counseling Your health care provider may ask you questions about:  Alcohol, tobacco, and drug use.  Emotional well-being.  Home and relationship well-being.  Sexual activity.  Eating habits.  History of falls.  Memory and ability to understand (cognition).  Work and work Statistician.  Pregnancy and menstrual history. What immunizations do I need?  Influenza (flu) vaccine  This is recommended every year. Tetanus, diphtheria, and pertussis (Tdap) vaccine  You may need a Td booster every 10 years. Varicella (chickenpox) vaccine  You may need this vaccine if you  have not already been vaccinated. Zoster (shingles) vaccine  You may need this after age 38. Pneumococcal conjugate (PCV13) vaccine  One dose is recommended after age 72. Pneumococcal polysaccharide (PPSV23) vaccine  One dose is recommended after age 12. Measles, mumps, and rubella (MMR) vaccine  You may need at least one dose of MMR if you were born in 1957 or later. You may also need a second dose. Meningococcal conjugate (MenACWY) vaccine  You may need this if you have certain conditions. Hepatitis A vaccine  You may need this if you have certain conditions or if you travel or work in places where you may be exposed to hepatitis A. Hepatitis B vaccine  You may need this if you have certain conditions or if you travel or work in places where you may be exposed to hepatitis B. Haemophilus influenzae type b (Hib) vaccine  You may need this if you have certain conditions. You may receive vaccines as individual doses or as more than one vaccine together in one shot (combination vaccines). Talk with your health care provider about the risks and benefits of combination vaccines. What tests do I need? Blood tests  Lipid and cholesterol levels. These may be checked every 5 years, or more frequently depending on your overall health.  Hepatitis C test.  Hepatitis B test. Screening  Lung cancer screening. You may have this screening every year starting at age 93 if you have a 30-pack-year history of smoking and currently smoke or have quit within the past 15 years.  Colorectal cancer screening. All adults should have  this screening starting at age 23 and continuing until age 51. Your health care provider may recommend screening at age 74 if you are at increased risk. You will have tests every 1-10 years, depending on your results and the type of screening test.  Diabetes screening. This is done by checking your blood sugar (glucose) after you have not eaten for a while (fasting). You  may have this done every 1-3 years.  Mammogram. This may be done every 1-2 years. Talk with your health care provider about how often you should have regular mammograms.  BRCA-related cancer screening. This may be done if you have a family history of breast, ovarian, tubal, or peritoneal cancers. Other tests  Sexually transmitted disease (STD) testing.  Bone density scan. This is done to screen for osteoporosis. You may have this done starting at age 24. Follow these instructions at home: Eating and drinking  Eat a diet that includes fresh fruits and vegetables, whole grains, lean protein, and low-fat dairy products. Limit your intake of foods with high amounts of sugar, saturated fats, and salt.  Take vitamin and mineral supplements as recommended by your health care provider.  Do not drink alcohol if your health care provider tells you not to drink.  If you drink alcohol: ? Limit how much you have to 0-1 drink a day. ? Be aware of how much alcohol is in your drink. In the [place] one drink equals one 12 oz bottle of beer (355 mL), one 5 oz glass of wine (148 mL), or one 1 oz glass of hard liquor (44 mL). Lifestyle  Take daily care of your teeth and gums.  Stay active. Exercise for at least 30 minutes on 5 or more days each week.  Do not use any products that contain nicotine or tobacco, such as cigarettes, e-cigarettes, and chewing tobacco. If you need help quitting, ask your health care provider.  If you are sexually active, practice safe sex. Use a condom or other form of protection in order to prevent STIs (sexually transmitted infections).  Talk with your health care provider about taking a low-dose aspirin or statin. What's next?  Go to your health care provider once a year for a well check visit.  Ask your health care provider how often you should have your eyes and teeth checked.  Stay up to date on all vaccines. This information is not intended to replace advice given  to you by your health care provider. Make sure you discuss any questions you have with your health care provider. Document Released: 02/08/2015 Document Revised: 01/06/2018 Document Reviewed: 01/06/2018 Elsevier Patient Education  2020 [employer].

## 2018-08-29 LAB — HEPATITIS C ANTIBODY
Hepatitis C Ab: NONREACTIVE
SIGNAL TO CUT-OFF: 0.01 (ref <1.00)

## 2018-09-16 DIAGNOSIS — D1801 Hemangioma of skin and subcutaneous tissue: Secondary | ICD-10-CM | POA: Diagnosis not present

## 2018-09-16 DIAGNOSIS — L814 Other melanin hyperpigmentation: Secondary | ICD-10-CM | POA: Diagnosis not present

## 2018-09-16 DIAGNOSIS — L231 Allergic contact dermatitis due to adhesives: Secondary | ICD-10-CM | POA: Diagnosis not present

## 2018-09-16 DIAGNOSIS — L720 Epidermal cyst: Secondary | ICD-10-CM | POA: Diagnosis not present

## 2018-09-16 DIAGNOSIS — D2261 Melanocytic nevi of right upper limb, including shoulder: Secondary | ICD-10-CM | POA: Diagnosis not present

## 2018-09-16 DIAGNOSIS — L821 Other seborrheic keratosis: Secondary | ICD-10-CM | POA: Diagnosis not present

## 2018-09-16 DIAGNOSIS — M7130 Other bursal cyst, unspecified site: Secondary | ICD-10-CM | POA: Diagnosis not present

## 2018-09-16 DIAGNOSIS — D225 Melanocytic nevi of trunk: Secondary | ICD-10-CM | POA: Diagnosis not present

## 2018-09-16 DIAGNOSIS — D2262 Melanocytic nevi of left upper limb, including shoulder: Secondary | ICD-10-CM | POA: Diagnosis not present

## 2018-09-16 DIAGNOSIS — L918 Other hypertrophic disorders of the skin: Secondary | ICD-10-CM | POA: Diagnosis not present

## 2018-09-19 ENCOUNTER — Other Ambulatory Visit: Payer: Self-pay | Admitting: *Deleted

## 2018-09-19 MED ORDER — METHOCARBAMOL 500 MG PO TABS: 500.0000 mg | ORAL_TABLET | Freq: Three times a day (TID) | ORAL | 0 refills | Status: DC

## 2018-09-19 NOTE — Progress Notes (Signed)
rob

## 2018-09-19 NOTE — Addendum Note (Signed)
Addended by: Cyd Silence on: 09/19/2018 02:31 PM   Modules accepted: Orders

## 2018-09-20 DIAGNOSIS — M25562 Pain in left knee: Secondary | ICD-10-CM | POA: Diagnosis not present

## 2018-09-29 DIAGNOSIS — M25562 Pain in left knee: Secondary | ICD-10-CM | POA: Diagnosis not present

## 2018-10-06 DIAGNOSIS — M25562 Pain in left knee: Secondary | ICD-10-CM | POA: Diagnosis not present

## 2018-10-07 ENCOUNTER — Other Ambulatory Visit: Payer: Self-pay | Admitting: Family Medicine

## 2018-10-10 ENCOUNTER — Other Ambulatory Visit: Payer: Self-pay | Admitting: Family Medicine

## 2018-10-20 DIAGNOSIS — L239 Allergic contact dermatitis, unspecified cause: Secondary | ICD-10-CM | POA: Diagnosis not present

## 2018-10-20 DIAGNOSIS — M25562 Pain in left knee: Secondary | ICD-10-CM | POA: Diagnosis not present

## 2018-10-20 DIAGNOSIS — L270 Generalized skin eruption due to drugs and medicaments taken internally: Secondary | ICD-10-CM | POA: Diagnosis not present

## 2018-10-20 DIAGNOSIS — L304 Erythema intertrigo: Secondary | ICD-10-CM | POA: Diagnosis not present

## 2018-10-20 DIAGNOSIS — L820 Inflamed seborrheic keratosis: Secondary | ICD-10-CM | POA: Diagnosis not present

## 2018-11-03 DIAGNOSIS — M25562 Pain in left knee: Secondary | ICD-10-CM | POA: Diagnosis not present

## 2018-11-08 DIAGNOSIS — M25562 Pain in left knee: Secondary | ICD-10-CM | POA: Diagnosis not present

## 2018-11-10 DIAGNOSIS — M25562 Pain in left knee: Secondary | ICD-10-CM | POA: Diagnosis not present

## 2018-11-15 DIAGNOSIS — M25562 Pain in left knee: Secondary | ICD-10-CM | POA: Diagnosis not present

## 2018-11-17 DIAGNOSIS — M25562 Pain in left knee: Secondary | ICD-10-CM | POA: Diagnosis not present

## 2018-11-22 DIAGNOSIS — M25562 Pain in left knee: Secondary | ICD-10-CM | POA: Diagnosis not present

## 2018-11-24 DIAGNOSIS — M25562 Pain in left knee: Secondary | ICD-10-CM | POA: Diagnosis not present

## 2018-11-25 DIAGNOSIS — Z23 Encounter for immunization: Secondary | ICD-10-CM | POA: Diagnosis not present

## 2018-11-28 ENCOUNTER — Ambulatory Visit: Payer: Medicare Other | Admitting: Family Medicine

## 2018-12-01 DIAGNOSIS — M25562 Pain in left knee: Secondary | ICD-10-CM | POA: Diagnosis not present

## 2018-12-02 ENCOUNTER — Ambulatory Visit (INDEPENDENT_AMBULATORY_CARE_PROVIDER_SITE_OTHER): Payer: Medicare Other

## 2018-12-02 ENCOUNTER — Other Ambulatory Visit: Payer: Self-pay

## 2018-12-02 DIAGNOSIS — Z0000 Encounter for general adult medical examination without abnormal findings: Secondary | ICD-10-CM

## 2018-12-02 NOTE — Patient Instructions (Signed)
Cynthia Barnett , Thank you for taking time to come for your Medicare Wellness Visit. I appreciate your ongoing commitment to your health goals. Please review the following plan we discussed and let me know if I can assist you in the future.   Screening recommendations/referrals: Colorectal Screening: up to date; last colonoscopy 11/20/10 Mammogram: recommended; please call to schedule Bone Density: recommended  Vision and Dental Exams: Recommended annual ophthalmology exams for early detection of glaucoma and other disorders of the eye Recommended annual dental exams for proper oral hygiene  Diabetic Exams: Diabetic Eye Exam: recommended yearly; up to date Diabetic Foot Exam: recommended yearly; up to date  Vaccinations: Influenza vaccine: completed 11/25/18 Pneumococcal vaccine: last 03/28/2013; booster needed at next visit Tdap vaccine: recommended ; Please call your insurance company to determine your out of pocket expense. You may receive this vaccine at your local pharmacy or Health Dept. Shingles vaccine: Please call your insurance company to determine your out of pocket expense for the Shingrix vaccine. You may receive this vaccine at your local pharmacy.  Advanced directives: Please bring a copy of your POA (Power of Attorney) and/or Living Will to your next appointment.  Goals: Recommend to drink at least 6-8 8oz glasses of water per day and consume a balanced diet rich in fresh fruits and vegetables.   Next appointment: Please schedule your Annual Wellness Visit with your Nurse Health Advisor in one year.  Preventive Care 61 Years and Older, Female Preventive care refers to lifestyle choices and visits with your health care provider that can promote health and wellness. What does preventive care include?  A yearly physical exam. This is also called an annual well check.  Dental exams once or twice a year.  Routine eye exams. Ask your health care provider how often you should have  your eyes checked.  Personal lifestyle choices, including:  Daily care of your teeth and gums.  Regular physical activity.  Eating a healthy diet.  Avoiding tobacco and drug use.  Limiting alcohol use.  Practicing safe sex.  Taking low-dose aspirin every day if recommended by your health care provider.  Taking vitamin and mineral supplements as recommended by your health care provider. What happens during an annual well check? The services and screenings done by your health care provider during your annual well check will depend on your age, overall health, lifestyle risk factors, and family history of disease. Counseling  Your health care provider may ask you questions about your:  Alcohol use.  Tobacco use.  Drug use.  Emotional well-being.  Home and relationship well-being.  Sexual activity.  Eating habits.  History of falls.  Memory and ability to understand (cognition).  Work and work Statistician.  Reproductive health. Screening  You may have the following tests or measurements:  Height, weight, and BMI.  Blood pressure.  Lipid and cholesterol levels. These may be checked every 5 years, or more frequently if you are over 67 years old.  Skin check.  Lung cancer screening. You may have this screening every year starting at age 70 if you have a 30-pack-year history of smoking and currently smoke or have quit within the past 15 years.  Fecal occult blood test (FOBT) of the stool. You may have this test every year starting at age 27.  Flexible sigmoidoscopy or colonoscopy. You may have a sigmoidoscopy every 5 years or a colonoscopy every 10 years starting at age 90.  Hepatitis C blood test.  Hepatitis B blood test.  Sexually  transmitted disease (STD) testing.  Diabetes screening. This is done by checking your blood sugar (glucose) after you have not eaten for a while (fasting). You may have this done every 1-3 years.  Bone density scan. This is  done to screen for osteoporosis. You may have this done starting at age 28.  Mammogram. This may be done every 1-2 years. Talk to your health care provider about how often you should have regular mammograms. Talk with your health care provider about your test results, treatment options, and if necessary, the need for more tests. Vaccines  Your health care provider may recommend certain vaccines, such as:  Influenza vaccine. This is recommended every year.  Tetanus, diphtheria, and acellular pertussis (Tdap, Td) vaccine. You may need a Td booster every 10 years.  Zoster vaccine. You may need this after age 27.  Pneumococcal 13-valent conjugate (PCV13) vaccine. One dose is recommended after age 85.  Pneumococcal polysaccharide (PPSV23) vaccine. One dose is recommended after age 33. Talk to your health care provider about which screenings and vaccines you need and how often you need them. This information is not intended to replace advice given to you by your health care provider. Make sure you discuss any questions you have with your health care provider. Document Released: 02/08/2015 Document Revised: 10/02/2015 Document Reviewed: 11/13/2014 Elsevier Interactive Patient Education  2017 [place] Prevention in the Home Falls can cause injuries. They can happen to people of all ages. There are many things you can do to make your home safe and to help prevent falls. What can I do on the outside of my home?  Regularly fix the edges of walkways and driveways and fix any cracks.  Remove anything that might make you trip as you walk through a door, such as a raised step or threshold.  Trim any bushes or trees on the path to your home.  Use bright outdoor lighting.  Clear any walking paths of anything that might make someone trip, such as rocks or tools.  Regularly check to see if handrails are loose or broken. Make sure that both sides of any steps have handrails.  Any raised  decks and porches should have guardrails on the edges.  Have any leaves, snow, or ice cleared regularly.  Use sand or salt on walking paths during winter.  Clean up any spills in your garage right away. This includes oil or grease spills. What can I do in the bathroom?  Use night lights.  Install grab bars by the toilet and in the tub and shower. Do not use towel bars as grab bars.  Use non-skid mats or decals in the tub or shower.  If you need to sit down in the shower, use a plastic, non-slip stool.  Keep the floor dry. Clean up any water that spills on the floor as soon as it happens.  Remove soap buildup in the tub or shower regularly.  Attach bath mats securely with double-sided non-slip rug tape.  Do not have throw rugs and other things on the floor that can make you trip. What can I do in the bedroom?  Use night lights.  Make sure that you have a light by your bed that is easy to reach.  Do not use any sheets or blankets that are too big for your bed. They should not hang down onto the floor.  Have a firm chair that has side arms. You can use this for support while you get dressed.  Do not have throw rugs and other things on the floor that can make you trip. What can I do in the kitchen?  Clean up any spills right away.  Avoid walking on wet floors.  Keep items that you use a lot in easy-to-reach places.  If you need to reach something above you, use a strong step stool that has a grab bar.  Keep electrical cords out of the way.  Do not use floor polish or wax that makes floors slippery. If you must use wax, use non-skid floor wax.  Do not have throw rugs and other things on the floor that can make you trip. What can I do with my stairs?  Do not leave any items on the stairs.  Make sure that there are handrails on both sides of the stairs and use them. Fix handrails that are broken or loose. Make sure that handrails are as long as the stairways.  Check  any carpeting to make sure that it is firmly attached to the stairs. Fix any carpet that is loose or worn.  Avoid having throw rugs at the top or bottom of the stairs. If you do have throw rugs, attach them to the floor with carpet tape.  Make sure that you have a light switch at the top of the stairs and the bottom of the stairs. If you do not have them, ask someone to add them for you. What else can I do to help prevent falls?  Wear shoes that:  Do not have high heels.  Have rubber bottoms.  Are comfortable and fit you well.  Are closed at the toe. Do not wear sandals.  If you use a stepladder:  Make sure that it is fully opened. Do not climb a closed stepladder.  Make sure that both sides of the stepladder are locked into place.  Ask someone to hold it for you, if possible.  Clearly mark and make sure that you can see:  Any grab bars or handrails.  First and last steps.  Where the edge of each step is.  Use tools that help you move around (mobility aids) if they are needed. These include:  Canes.  Walkers.  Scooters.  Crutches.  Turn on the lights when you go into a dark area. Replace any light bulbs as soon as they burn out.  Set up your furniture so you have a clear path. Avoid moving your furniture around.  If any of your floors are uneven, fix them.  If there are any pets around you, be aware of where they are.  Review your medicines with your doctor. Some medicines can make you feel dizzy. This can increase your chance of falling. Ask your doctor what other things that you can do to help prevent falls. This information is not intended to replace advice given to you by your health care provider. Make sure you discuss any questions you have with your health care provider. Document Released: 11/08/2008 Document Revised: 06/20/2015 Document Reviewed: 02/16/2014 Elsevier Interactive Patient Education  2017 [employer].

## 2018-12-02 NOTE — Progress Notes (Signed)
I have reviewed the documentation from the recent AWV done by Courtney Slade, RN; I agree with the documentation and will follow up on any recommendations or abnormal findings as suggested.  

## 2018-12-02 NOTE — Progress Notes (Signed)
I connected with [name] on 12/02/18 at 1400 by phone and verified that I am speaking with the correct person using two identifiers. Location patient: Home Location provider: [facility], Office Persons participating in the virtual visit: Denman George LPN and Dr. Billey Chang    I discussed the limitations of evaluation and management by telemedicine and the availability of in person appointments. The patient expressed understanding and agreed to proceed.  Subjective:   Cynthia Barnett is a 73 y.o. female who presents for Medicare Annual (Subsequent) preventive examination.  Review of Systems:   Cardiac Risk Factors include: advanced age (>48men, >26 women);diabetes mellitus;hypertension    Objective:     Vitals: There were no vitals taken for this visit.  There is no height or weight on file to calculate BMI.  Advanced Directives 12/02/2018 12/25/2016  Does Patient Have a Medical Advance Directive? No No  Would patient like information on creating a medical advance directive? No - Patient declined Yes (MAU/Ambulatory/Procedural Areas - Information given)    Tobacco Social History   Tobacco Use  Smoking Status Former Smoker  . Packs/day: 0.50  . Years: 2.00  . Pack years: 1.00  . Types: Cigarettes  Smokeless Tobacco Never Used  Tobacco Comment   Stopped over 15 years ago     Counseling given: Not Answered Comment: Stopped over 15 years ago   Clinical Intake:  Pre-visit preparation completed: Yes  Pain : No/denies pain  Diabetes: Yes(followed by Endocrinology) CBG done?: No Did pt. bring in CBG monitor from home?: No  How often do you need to have someone help you when you read instructions, pamphlets, or other written materials from your doctor or pharmacy?: 1 - Never  Interpreter Needed?: No  Information entered by :: Denman George LPN  Past Medical History:  Diagnosis Date  . Allergy   . Arthritis   . Colon polyps   . Diabetes mellitus   .  Fatty infiltration of liver 01/21/2017   By CT scan 2018  . GERD (gastroesophageal reflux disease) 08/25/2012  . Hypertension   . Solitary pulmonary nodule on lung CT 12/25/2016   02/2016 - incidental finding; surveillance CT pending  . Thyroid disease   . Uterine fibroid    Past Surgical History:  Procedure Laterality Date  . carpal tunnel  about 2005   right, Dr Fredna Dow  . CATARACT EXTRACTION, BILATERAL    . DILATION AND CURETTAGE OF UTERUS    . EYE SURGERY  05/24/14, 03/20/15   Cataract Surgery  . thumb surgury  2003   dr Fredna Dow   Family History  Problem Relation Age of Onset  . Arthritis Other   . Cancer Other        colon, ovarian, breast and prostate cancer  . Heart disease Other   . Hypertension Other   . Diabetes Other   . Cancer Mother        Breast Cancer  . Endometrial cancer Mother   . Miscarriages / [place] Mother   . Arthritis Father   . Cancer Father        Basal Cell Carcinoma  . Diabetes Father   . Heart failure Father   . Hypertension Father   . Lymphoma Sister   . Thyroid disease Daughter   . Cancer Son        Basal Cell Carcinoma  . Cancer Sister    Social History   Socioeconomic History  . Marital status: Divorced    Spouse name:  Not on file  . Number of children: Not on file  . Years of education: 71  . Highest education level: Not on file  Occupational History  . 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 Financial resource strain: Not on file  . Food insecurity    Worry: Not on file    Inability: Not on file  . Transportation needs    Medical: Not on file    Non-medical: Not on file  Tobacco Use  . Smoking status: Former Smoker    Packs/day: 0.50    Years: 2.00    Pack years: 1.00    Types: Cigarettes  . Smokeless tobacco: Never Used  . Tobacco comment: Stopped over 15 years ago  Substance and Sexual Activity  . Alcohol use: Yes    Comment: Occasional wine or mixed drink  . Drug use: Never  .  Sexual activity: Not Currently    Birth control/protection: None  Lifestyle  . Physical activity    Days per week: Not on file    Minutes per session: Not on file  . Stress: Not on file  Relationships  . Social Herbalist on phone: Not on file    Gets together: Not on file    Attends religious service: Not on file    Active member of club or organization: Not on file    Attends meetings of clubs or organizations: Not on file    Relationship status: Not on file  Other Topics Concern  . Not on file  Social History Narrative  . Not on file    Outpatient Encounter Medications as of 12/02/2018  Medication Sig  . aspirin 81 MG tablet Take 81 mg by mouth daily.  Marland Kitchen azelastine (ASTELIN) 0.1 % nasal spray PLACE 1 SPRAY INTO BOTH NOSTRILS TWO TIMES DAILY. USE IN EACH NOSTRIL AS DIRECTED  . BD PEN NEEDLE NANO U/F 32G X 4 MM MISC See admin instructions.  . Cholecalciferol (VITAMIN D-3 PO) Take by mouth daily.  . ciclopirox (LOPROX) 0.77 % cream APPLY TO SKIN FOLDS AREA BID  . diphenhydrAMINE (BENADRYL) 50 MG tablet Take 50 mg by mouth at bedtime as needed for itching.  Marland Kitchen DIVIGEL 0.5 MG/0.5GM GEL APPLY 0.5 MG TO THE AFFECTED AREA ON SKIN ONCE DAILY  . hydroxychloroquine (PLAQUENIL) 200 MG tablet TK 2 TS PO 1 TIME A WK  . levothyroxine (SYNTHROID) 137 MCG tablet Take 137 mcg by mouth daily.  Marland Kitchen lisinopril-hydrochlorothiazide (PRINZIDE,ZESTORETIC) 10-12.5 MG tablet Take 1 tablet by mouth daily. Please schedule appointment for blood pressure follow up for more refills  . Melatonin 200 MCG TABS Take by mouth.  . metFORMIN (GLUCOPHAGE) 1000 MG tablet Take 1,000 mg by mouth 2 (two) times daily with a meal.  . pioglitazone (ACTOS) 15 MG tablet Take 15 mg by mouth daily.  . Semaglutide (OZEMPIC, 1 MG/DOSE, ) Inject into the skin.  Marland Kitchen triamcinolone cream (KENALOG) 0.1 % APPLY TO RASH ON BACK AND ABDOMEN BID  . Turmeric (QC TUMERIC COMPLEX PO) Take by mouth.  . zinc gluconate 50 MG tablet  Take 50 mg by mouth daily.  . [DISCONTINUED] OZEMPIC, 0.25 OR 0.5 MG/DOSE, 2 MG/1.5ML SOPN   . [DISCONTINUED] methocarbamol (ROBAXIN) 500 MG tablet TAKE 1 TABLET BY MOUTH THREE TIMES DAILY  . [DISCONTINUED] OZEMPIC, 1 MG/DOSE, 2 MG/1.5ML SOPN Inject 1 mg into the skin once a week.   No facility-administered encounter medications on file as of 12/02/2018.  Activities of Daily Living In your present state of health, do you have any difficulty performing the following activities: 12/02/2018 08/26/2018  Hearing? Y N  Vision? N N  Difficulty concentrating or making decisions? N N  Walking or climbing stairs? Y N  Comment due to left knee problems -  Dressing or bathing? N N  Doing errands, shopping? N N  Preparing Food and eating ? N -  Using the Toilet? N -  In the past six months, have you accidently leaked urine? N -  Do you have problems with loss of bowel control? N -  Managing your Medications? N -  Managing your Finances? N -  Housekeeping or managing your Housekeeping? N -  Some recent data might be hidden    Patient Care Team: Leamon Arnt, MD as PCP - General (Family Medicine) Dene Gentry, MD as Consulting Physician (Sports Medicine) Gerome Apley, MD as Referring Physician (Endocrinology) Dorien Chihuahua, MD as Referring Physician (Obstetrics and Gynecology) Jola Schmidt, MD as Consulting Physician (Ophthalmology) Sydnee Levans, MD as Consulting Physician (Dermatology) Dene Gentry, MD as Consulting Physician (Sports Medicine)    Assessment:   This is a routine wellness examination for [place].  Exercise Activities and Dietary recommendations Current Exercise Habits: The patient does not participate in regular exercise at present  Goals    . Weight (lb) < 200 lb (90.7 kg)     Lose weight by watching diet.        Fall Risk Fall Risk  12/02/2018 08/26/2018 12/25/2016 12/02/2016 03/28/2013  Falls in the past year? 0 0 No No No  Number falls in  past yr: - 0 - - -  Injury with Fall? 0 0 - - -  Follow up Falls evaluation completed;Education provided;Falls prevention discussed Falls evaluation completed - - -   Is the patient's home free of loose throw rugs in walkways, pet beds, electrical cords, etc?   yes      Grab bars in the bathroom? yes      Handrails on the stairs?   yes      Adequate lighting?   yes   Depression Screen PHQ 2/9 Scores 12/02/2018 08/26/2018 12/25/2016 12/02/2016  PHQ - 2 Score 0 0 0 0  PHQ- 9 Score - - - -     Cognitive Function-no cognitive concerns at this time      6CIT Screen 12/02/2018  What Year? 0 points  What month? 0 points  What time? 0 points  Count back from 20 0 points  Months in reverse 0 points  Repeat phrase 0 points  Total Score 0    Immunization History  Administered Date(s) Administered  . Influenza, High Dose Seasonal PF 11/02/2015, 10/15/2016, 11/15/2017, 11/25/2018  . Influenza-Unspecified 10/27/2010, 10/29/2016  . Pneumococcal Conjugate-13 05/27/2010, 03/28/2013  . Pneumococcal-Unspecified 03/28/2013  . Zoster 06/09/2010    Qualifies for Shingles Vaccine?Discussed and patient will check with pharmacy for coverage.  Patient education handout provided   Screening Tests Health Maintenance  Topic Date Due  . TETANUS/TDAP  02/05/1965  . DEXA SCAN  06/08/2013  . OPHTHALMOLOGY EXAM  03/13/2017  . INFLUENZA VACCINE  08/27/2018  . MAMMOGRAM  11/20/2018  . HEMOGLOBIN A1C  02/26/2019  . FOOT EXAM  08/26/2019  . COLONOSCOPY  11/19/2020  . Hepatitis C Screening  Completed  . PNA vac Low Risk Adult  Completed    Cancer Screenings: Lung: Low Dose CT Chest recommended if Age 93-80 years, 30 pack-year currently  smoking OR have quit w/in 15years. Patient does not qualify. Breast:  Up to date on Mammogram? Yes; patient will call to schedule  Up to date of Bone Density/Dexa? No; recommended Colorectal: colonoscopy last 11/20/10    Plan:  I have personally reviewed and  addressed the Medicare Annual Wellness questionnaire and have noted the following in the patient's chart:  A. Medical and social history B. Use of alcohol, tobacco or illicit drugs  C. Current medications and supplements D. Functional ability and status E.  Nutritional status F.  Physical activity G. Advance directives H. List of other physicians I.  Hospitalizations, surgeries, and ER visits in previous 12 months J.  [place] such as hearing and vision if needed, cognitive and depression L. Referrals, records requested, and appointments- none   In addition, I have reviewed and discussed with patient certain preventive protocols, quality metrics, and best practice recommendations. A written personalized care plan for preventive services as well as general preventive health recommendations were provided to patient.   Signed,  Denman George, LPN  Nurse Health Advisor   Nurse Notes: no additional

## 2018-12-06 DIAGNOSIS — M25562 Pain in left knee: Secondary | ICD-10-CM | POA: Diagnosis not present

## 2018-12-08 DIAGNOSIS — M25562 Pain in left knee: Secondary | ICD-10-CM | POA: Diagnosis not present

## 2018-12-09 DIAGNOSIS — R809 Proteinuria, unspecified: Secondary | ICD-10-CM | POA: Diagnosis not present

## 2018-12-09 DIAGNOSIS — E1140 Type 2 diabetes mellitus with diabetic neuropathy, unspecified: Secondary | ICD-10-CM | POA: Diagnosis not present

## 2018-12-09 DIAGNOSIS — E039 Hypothyroidism, unspecified: Secondary | ICD-10-CM | POA: Diagnosis not present

## 2018-12-09 DIAGNOSIS — E1129 Type 2 diabetes mellitus with other diabetic kidney complication: Secondary | ICD-10-CM | POA: Diagnosis not present

## 2018-12-14 DIAGNOSIS — M25562 Pain in left knee: Secondary | ICD-10-CM | POA: Diagnosis not present

## 2018-12-15 DIAGNOSIS — E039 Hypothyroidism, unspecified: Secondary | ICD-10-CM | POA: Diagnosis not present

## 2018-12-15 DIAGNOSIS — E1140 Type 2 diabetes mellitus with diabetic neuropathy, unspecified: Secondary | ICD-10-CM | POA: Diagnosis not present

## 2018-12-15 DIAGNOSIS — I10 Essential (primary) hypertension: Secondary | ICD-10-CM | POA: Diagnosis not present

## 2018-12-15 DIAGNOSIS — E1159 Type 2 diabetes mellitus with other circulatory complications: Secondary | ICD-10-CM | POA: Diagnosis not present

## 2018-12-20 DIAGNOSIS — M25562 Pain in left knee: Secondary | ICD-10-CM | POA: Diagnosis not present

## 2018-12-21 ENCOUNTER — Other Ambulatory Visit: Payer: Self-pay

## 2018-12-28 DIAGNOSIS — M25562 Pain in left knee: Secondary | ICD-10-CM | POA: Diagnosis not present

## 2019-01-04 DIAGNOSIS — M25562 Pain in left knee: Secondary | ICD-10-CM | POA: Diagnosis not present

## 2019-02-02 DIAGNOSIS — M25562 Pain in left knee: Secondary | ICD-10-CM | POA: Diagnosis not present

## 2019-02-16 DIAGNOSIS — M25562 Pain in left knee: Secondary | ICD-10-CM | POA: Diagnosis not present

## 2019-02-24 ENCOUNTER — Ambulatory Visit: Payer: Medicare Other | Admitting: Family Medicine

## 2019-03-02 DIAGNOSIS — M25562 Pain in left knee: Secondary | ICD-10-CM | POA: Diagnosis not present

## 2019-03-09 DIAGNOSIS — M25562 Pain in left knee: Secondary | ICD-10-CM | POA: Diagnosis not present

## 2019-03-23 DIAGNOSIS — M25562 Pain in left knee: Secondary | ICD-10-CM | POA: Diagnosis not present

## 2019-04-06 DIAGNOSIS — M25562 Pain in left knee: Secondary | ICD-10-CM | POA: Diagnosis not present

## 2019-04-20 DIAGNOSIS — M25562 Pain in left knee: Secondary | ICD-10-CM | POA: Diagnosis not present

## 2019-04-27 DIAGNOSIS — M25562 Pain in left knee: Secondary | ICD-10-CM | POA: Diagnosis not present

## 2019-05-04 DIAGNOSIS — M25562 Pain in left knee: Secondary | ICD-10-CM | POA: Diagnosis not present

## 2019-05-11 DIAGNOSIS — M25562 Pain in left knee: Secondary | ICD-10-CM | POA: Diagnosis not present

## 2019-06-01 DIAGNOSIS — M25562 Pain in left knee: Secondary | ICD-10-CM | POA: Diagnosis not present

## 2019-06-01 DIAGNOSIS — M25561 Pain in right knee: Secondary | ICD-10-CM | POA: Diagnosis not present

## 2019-06-08 DIAGNOSIS — M25562 Pain in left knee: Secondary | ICD-10-CM | POA: Diagnosis not present

## 2019-06-08 DIAGNOSIS — M25561 Pain in right knee: Secondary | ICD-10-CM | POA: Diagnosis not present

## 2019-06-22 DIAGNOSIS — M25562 Pain in left knee: Secondary | ICD-10-CM | POA: Diagnosis not present

## 2019-06-22 DIAGNOSIS — M25561 Pain in right knee: Secondary | ICD-10-CM | POA: Diagnosis not present

## 2019-06-29 DIAGNOSIS — M25561 Pain in right knee: Secondary | ICD-10-CM | POA: Diagnosis not present

## 2019-06-29 DIAGNOSIS — M25562 Pain in left knee: Secondary | ICD-10-CM | POA: Diagnosis not present

## 2019-07-13 DIAGNOSIS — M25561 Pain in right knee: Secondary | ICD-10-CM | POA: Diagnosis not present

## 2019-07-13 DIAGNOSIS — M25562 Pain in left knee: Secondary | ICD-10-CM | POA: Diagnosis not present

## 2019-07-14 DIAGNOSIS — E1129 Type 2 diabetes mellitus with other diabetic kidney complication: Secondary | ICD-10-CM | POA: Diagnosis not present

## 2019-07-14 DIAGNOSIS — E039 Hypothyroidism, unspecified: Secondary | ICD-10-CM | POA: Diagnosis not present

## 2019-07-14 DIAGNOSIS — R809 Proteinuria, unspecified: Secondary | ICD-10-CM | POA: Diagnosis not present

## 2019-07-20 LAB — HEMOGLOBIN A1C: Hemoglobin A1C: 6.4

## 2019-07-21 ENCOUNTER — Encounter: Payer: Self-pay | Admitting: Family Medicine

## 2019-08-31 DIAGNOSIS — M25561 Pain in right knee: Secondary | ICD-10-CM | POA: Diagnosis not present

## 2019-08-31 DIAGNOSIS — M25562 Pain in left knee: Secondary | ICD-10-CM | POA: Diagnosis not present

## 2019-09-14 DIAGNOSIS — M25561 Pain in right knee: Secondary | ICD-10-CM | POA: Diagnosis not present

## 2019-09-14 DIAGNOSIS — M25562 Pain in left knee: Secondary | ICD-10-CM | POA: Diagnosis not present

## 2019-10-03 DIAGNOSIS — L820 Inflamed seborrheic keratosis: Secondary | ICD-10-CM | POA: Diagnosis not present

## 2019-10-03 DIAGNOSIS — D692 Other nonthrombocytopenic purpura: Secondary | ICD-10-CM | POA: Diagnosis not present

## 2019-10-04 DIAGNOSIS — M25561 Pain in right knee: Secondary | ICD-10-CM | POA: Diagnosis not present

## 2019-10-04 DIAGNOSIS — M25562 Pain in left knee: Secondary | ICD-10-CM | POA: Diagnosis not present

## 2019-10-11 DIAGNOSIS — M25562 Pain in left knee: Secondary | ICD-10-CM | POA: Diagnosis not present

## 2019-10-11 DIAGNOSIS — M25561 Pain in right knee: Secondary | ICD-10-CM | POA: Diagnosis not present

## 2019-10-19 DIAGNOSIS — M25562 Pain in left knee: Secondary | ICD-10-CM | POA: Diagnosis not present

## 2019-10-19 DIAGNOSIS — M25561 Pain in right knee: Secondary | ICD-10-CM | POA: Diagnosis not present

## 2019-10-25 DIAGNOSIS — M25561 Pain in right knee: Secondary | ICD-10-CM | POA: Diagnosis not present

## 2019-10-25 DIAGNOSIS — M25562 Pain in left knee: Secondary | ICD-10-CM | POA: Diagnosis not present

## 2019-10-30 DIAGNOSIS — Z23 Encounter for immunization: Secondary | ICD-10-CM | POA: Diagnosis not present

## 2019-10-31 ENCOUNTER — Other Ambulatory Visit: Payer: Self-pay | Admitting: Family Medicine

## 2019-11-02 DIAGNOSIS — M25562 Pain in left knee: Secondary | ICD-10-CM | POA: Diagnosis not present

## 2019-11-02 DIAGNOSIS — M25561 Pain in right knee: Secondary | ICD-10-CM | POA: Diagnosis not present

## 2019-11-08 DIAGNOSIS — M25561 Pain in right knee: Secondary | ICD-10-CM | POA: Diagnosis not present

## 2019-11-08 DIAGNOSIS — M25562 Pain in left knee: Secondary | ICD-10-CM | POA: Diagnosis not present

## 2019-11-14 DIAGNOSIS — M25562 Pain in left knee: Secondary | ICD-10-CM | POA: Diagnosis not present

## 2019-11-14 DIAGNOSIS — M25561 Pain in right knee: Secondary | ICD-10-CM | POA: Diagnosis not present

## 2019-11-22 DIAGNOSIS — M25562 Pain in left knee: Secondary | ICD-10-CM | POA: Diagnosis not present

## 2019-11-22 DIAGNOSIS — M25561 Pain in right knee: Secondary | ICD-10-CM | POA: Diagnosis not present

## 2019-11-23 DIAGNOSIS — L578 Other skin changes due to chronic exposure to nonionizing radiation: Secondary | ICD-10-CM | POA: Diagnosis not present

## 2019-11-23 DIAGNOSIS — D225 Melanocytic nevi of trunk: Secondary | ICD-10-CM | POA: Diagnosis not present

## 2019-11-23 DIAGNOSIS — D1801 Hemangioma of skin and subcutaneous tissue: Secondary | ICD-10-CM | POA: Diagnosis not present

## 2019-11-23 DIAGNOSIS — L821 Other seborrheic keratosis: Secondary | ICD-10-CM | POA: Diagnosis not present

## 2019-11-23 DIAGNOSIS — L814 Other melanin hyperpigmentation: Secondary | ICD-10-CM | POA: Diagnosis not present

## 2019-11-23 DIAGNOSIS — D2262 Melanocytic nevi of left upper limb, including shoulder: Secondary | ICD-10-CM | POA: Diagnosis not present

## 2019-11-24 DIAGNOSIS — E559 Vitamin D deficiency, unspecified: Secondary | ICD-10-CM | POA: Diagnosis not present

## 2019-11-24 DIAGNOSIS — J019 Acute sinusitis, unspecified: Secondary | ICD-10-CM | POA: Diagnosis not present

## 2019-11-24 DIAGNOSIS — J309 Allergic rhinitis, unspecified: Secondary | ICD-10-CM | POA: Diagnosis not present

## 2019-12-07 DIAGNOSIS — M25562 Pain in left knee: Secondary | ICD-10-CM | POA: Diagnosis not present

## 2019-12-07 DIAGNOSIS — M25561 Pain in right knee: Secondary | ICD-10-CM | POA: Diagnosis not present

## 2019-12-27 DIAGNOSIS — M25561 Pain in right knee: Secondary | ICD-10-CM | POA: Diagnosis not present

## 2019-12-27 DIAGNOSIS — M25562 Pain in left knee: Secondary | ICD-10-CM | POA: Diagnosis not present

## 2020-01-11 DIAGNOSIS — M25562 Pain in left knee: Secondary | ICD-10-CM | POA: Diagnosis not present

## 2020-01-11 DIAGNOSIS — M25561 Pain in right knee: Secondary | ICD-10-CM | POA: Diagnosis not present

## 2020-01-17 DIAGNOSIS — M25562 Pain in left knee: Secondary | ICD-10-CM | POA: Diagnosis not present

## 2020-01-17 DIAGNOSIS — M25561 Pain in right knee: Secondary | ICD-10-CM | POA: Diagnosis not present

## 2020-03-01 DIAGNOSIS — R809 Proteinuria, unspecified: Secondary | ICD-10-CM | POA: Diagnosis not present

## 2020-03-01 DIAGNOSIS — E1129 Type 2 diabetes mellitus with other diabetic kidney complication: Secondary | ICD-10-CM | POA: Diagnosis not present

## 2020-03-01 DIAGNOSIS — E039 Hypothyroidism, unspecified: Secondary | ICD-10-CM | POA: Diagnosis not present

## 2020-03-05 DIAGNOSIS — M25561 Pain in right knee: Secondary | ICD-10-CM | POA: Diagnosis not present

## 2020-03-05 DIAGNOSIS — M25562 Pain in left knee: Secondary | ICD-10-CM | POA: Diagnosis not present

## 2020-03-07 DIAGNOSIS — E039 Hypothyroidism, unspecified: Secondary | ICD-10-CM | POA: Diagnosis not present

## 2020-03-07 DIAGNOSIS — I152 Hypertension secondary to endocrine disorders: Secondary | ICD-10-CM | POA: Diagnosis not present

## 2020-03-07 DIAGNOSIS — E1129 Type 2 diabetes mellitus with other diabetic kidney complication: Secondary | ICD-10-CM | POA: Diagnosis not present

## 2020-03-07 DIAGNOSIS — E1159 Type 2 diabetes mellitus with other circulatory complications: Secondary | ICD-10-CM | POA: Diagnosis not present

## 2020-03-07 DIAGNOSIS — R809 Proteinuria, unspecified: Secondary | ICD-10-CM | POA: Diagnosis not present

## 2020-03-14 DIAGNOSIS — M25562 Pain in left knee: Secondary | ICD-10-CM | POA: Diagnosis not present

## 2020-03-14 DIAGNOSIS — M25561 Pain in right knee: Secondary | ICD-10-CM | POA: Diagnosis not present

## 2020-03-21 DIAGNOSIS — M25561 Pain in right knee: Secondary | ICD-10-CM | POA: Diagnosis not present

## 2020-03-21 DIAGNOSIS — M25562 Pain in left knee: Secondary | ICD-10-CM | POA: Diagnosis not present

## 2020-03-22 DIAGNOSIS — Z1231 Encounter for screening mammogram for malignant neoplasm of breast: Secondary | ICD-10-CM | POA: Diagnosis not present

## 2020-03-28 DIAGNOSIS — M25562 Pain in left knee: Secondary | ICD-10-CM | POA: Diagnosis not present

## 2020-03-28 DIAGNOSIS — M25561 Pain in right knee: Secondary | ICD-10-CM | POA: Diagnosis not present

## 2020-05-30 DIAGNOSIS — M25561 Pain in right knee: Secondary | ICD-10-CM | POA: Diagnosis not present

## 2020-05-30 DIAGNOSIS — M25562 Pain in left knee: Secondary | ICD-10-CM | POA: Diagnosis not present

## 2020-09-06 DIAGNOSIS — E039 Hypothyroidism, unspecified: Secondary | ICD-10-CM | POA: Diagnosis not present

## 2020-09-12 DIAGNOSIS — R809 Proteinuria, unspecified: Secondary | ICD-10-CM | POA: Diagnosis not present

## 2020-09-12 DIAGNOSIS — I152 Hypertension secondary to endocrine disorders: Secondary | ICD-10-CM | POA: Diagnosis not present

## 2020-09-12 DIAGNOSIS — E1129 Type 2 diabetes mellitus with other diabetic kidney complication: Secondary | ICD-10-CM | POA: Diagnosis not present

## 2020-09-12 DIAGNOSIS — E1159 Type 2 diabetes mellitus with other circulatory complications: Secondary | ICD-10-CM | POA: Diagnosis not present

## 2020-09-12 DIAGNOSIS — E039 Hypothyroidism, unspecified: Secondary | ICD-10-CM | POA: Diagnosis not present

## 2020-11-08 DIAGNOSIS — E1129 Type 2 diabetes mellitus with other diabetic kidney complication: Secondary | ICD-10-CM | POA: Diagnosis not present

## 2020-11-08 DIAGNOSIS — R809 Proteinuria, unspecified: Secondary | ICD-10-CM | POA: Diagnosis not present

## 2020-11-08 DIAGNOSIS — E039 Hypothyroidism, unspecified: Secondary | ICD-10-CM | POA: Diagnosis not present

## 2020-11-26 DIAGNOSIS — J019 Acute sinusitis, unspecified: Secondary | ICD-10-CM | POA: Diagnosis not present

## 2020-11-26 DIAGNOSIS — J069 Acute upper respiratory infection, unspecified: Secondary | ICD-10-CM | POA: Diagnosis not present

## 2020-11-26 DIAGNOSIS — R070 Pain in throat: Secondary | ICD-10-CM | POA: Diagnosis not present

## 2021-10-20 ENCOUNTER — Encounter: Payer: Self-pay | Admitting: *Deleted

## 2021-11-06 ENCOUNTER — Telehealth: Payer: Self-pay

## 2021-11-06 NOTE — Telephone Encounter (Signed)
Talked with patient to verify gel injection for right knee.  VOB submitted for Orthovisc, right knee

## 2021-11-06 NOTE — Telephone Encounter (Signed)
-----   Message from Laurann Montana, RT sent at 11/05/2021 11:38 AM EDT ----- Regarding: RE: Synvisc  Hey Dr Junius Roads We would be happy to do so. Please let her know that Santiago Graf will be in touch with her soon about authorization but will also go ahead and have Cassandra schedule patient.  ----- Message ----- From: Eunice Blase, MD Sent: 11/04/2021   4:54 PM EDT To: Meredith Pel, MD; Laurann Montana, RT Subject: Synvisc                                        Good afternoon! By chance would you be willing to see my patient for Synvisc injection for her knee DJD? I can't get it at a reasonable price, and she has Medicare so it should cover.  I can fax a referral if needed.  Thanks! Ronalee Belts

## 2021-11-10 ENCOUNTER — Other Ambulatory Visit: Payer: Self-pay

## 2021-11-10 DIAGNOSIS — M1711 Unilateral primary osteoarthritis, right knee: Secondary | ICD-10-CM

## 2021-11-14 ENCOUNTER — Ambulatory Visit: Payer: Medicare HMO | Admitting: Surgical

## 2021-11-14 DIAGNOSIS — M1711 Unilateral primary osteoarthritis, right knee: Secondary | ICD-10-CM

## 2021-11-15 ENCOUNTER — Encounter: Payer: Self-pay | Admitting: Surgical

## 2021-11-15 MED ORDER — HYALURONAN 30 MG/2ML IX SOSY
30.0000 mg | PREFILLED_SYRINGE | INTRA_ARTICULAR | Status: AC | PRN
  Administered 2021-11-14: 30 mg via INTRA_ARTICULAR

## 2021-11-15 MED ORDER — LIDOCAINE HCL 1 % IJ SOLN
5.0000 mL | INTRAMUSCULAR | Status: AC | PRN
  Administered 2021-11-14: 5 mL

## 2021-11-15 NOTE — Progress Notes (Signed)
   Procedure Note  Patient: Cynthia Barnett             Date of Birth: 1945-12-25           MRN: [id number]             Visit Date: 11/14/2021  Procedures: Visit Diagnoses:  1. Unilateral primary osteoarthritis, right knee     Large Joint Inj: R knee on 11/14/2021 10:01 AM Indications: pain, joint swelling and diagnostic evaluation Details: 18 G 1.5 in needle, superolateral approach  Arthrogram: No  Medications: 5 mL lidocaine 1 %; 30 mg Hyaluronan 30 MG/2ML Outcome: tolerated well, no immediate complications  Patient comes in today for planned Orthovisc injection.  Referred by Dr. Junius Roads.  She has second injection in series of 3 scheduled with Dr. Marlou Sa next week. Procedure, treatment alternatives, risks and benefits explained, specific risks discussed. Consent was given by the patient. Immediately prior to procedure a time out was called to verify the correct patient, procedure, equipment, support staff and site/side marked as required. Patient was prepped and draped in the usual sterile fashion.

## 2021-11-21 ENCOUNTER — Encounter: Payer: Self-pay | Admitting: Orthopedic Surgery

## 2021-11-21 ENCOUNTER — Ambulatory Visit: Payer: Medicare HMO | Admitting: Orthopedic Surgery

## 2021-11-21 DIAGNOSIS — M1711 Unilateral primary osteoarthritis, right knee: Secondary | ICD-10-CM | POA: Diagnosis not present

## 2021-11-21 MED ORDER — HYALURONAN 30 MG/2ML IX SOSY
30.0000 mg | PREFILLED_SYRINGE | INTRA_ARTICULAR | Status: AC | PRN
  Administered 2021-11-21: 30 mg via INTRA_ARTICULAR

## 2021-11-21 MED ORDER — LIDOCAINE HCL 1 % IJ SOLN
5.0000 mL | INTRAMUSCULAR | Status: AC | PRN
  Administered 2021-11-21: 5 mL

## 2021-11-21 NOTE — Progress Notes (Signed)
   Procedure Note  Patient: Cynthia Barnett             Date of Birth: 1945-06-21           MRN: [id number]             Visit Date: 11/21/2021  Procedures: Visit Diagnoses:  1. Unilateral primary osteoarthritis, right knee     Large Joint Inj: R knee on 11/21/2021 11:32 PM Indications: diagnostic evaluation, joint swelling and pain Details: 18 G 1.5 in needle, superolateral approach  Arthrogram: No  Medications: 5 mL lidocaine 1 %; 30 mg Hyaluronan 30 MG/2ML Outcome: tolerated well, no immediate complications Procedure, treatment alternatives, risks and benefits explained, specific risks discussed. Consent was given by the patient. Immediately prior to procedure a time out was called to verify the correct patient, procedure, equipment, support staff and site/side marked as required. Patient was prepped and draped in the usual sterile fashion.     Lot #[id number]

## 2021-11-28 ENCOUNTER — Ambulatory Visit: Payer: Medicare HMO | Admitting: Orthopedic Surgery

## 2021-11-28 ENCOUNTER — Encounter: Payer: Self-pay | Admitting: Orthopedic Surgery

## 2021-11-28 DIAGNOSIS — M1711 Unilateral primary osteoarthritis, right knee: Secondary | ICD-10-CM | POA: Diagnosis not present

## 2021-11-28 MED ORDER — BUPIVACAINE HCL 0.25 % IJ SOLN
4.0000 mL | INTRAMUSCULAR | Status: AC | PRN
  Administered 2021-11-28: 4 mL via INTRA_ARTICULAR

## 2021-11-28 MED ORDER — LIDOCAINE HCL 1 % IJ SOLN
5.0000 mL | INTRAMUSCULAR | Status: AC | PRN
  Administered 2021-11-28: 5 mL

## 2021-11-28 MED ORDER — HYALURONAN 30 MG/2ML IX SOSY
30.0000 mg | PREFILLED_SYRINGE | INTRA_ARTICULAR | Status: AC | PRN
  Administered 2021-11-28: 30 mg via INTRA_ARTICULAR

## 2021-11-28 NOTE — Progress Notes (Signed)
   Procedure Note  Patient: Cynthia Barnett             Date of Birth: 07-02-1945           MRN: [id number]             Visit Date: 11/28/2021  Procedures: Visit Diagnoses: No diagnosis found.  Large Joint Inj: R knee on 11/28/2021 11:37 AM Indications: diagnostic evaluation, joint swelling and pain Details: 18 G 1.5 in needle, superolateral approach  Arthrogram: No  Medications: 5 mL lidocaine 1 %; 4 mL bupivacaine 0.25 %; 30 mg Hyaluronan 30 MG/2ML Outcome: tolerated well, no immediate complications Procedure, treatment alternatives, risks and benefits explained, specific risks discussed. Consent was given by the patient. Immediately prior to procedure a time out was called to verify the correct patient, procedure, equipment, support staff and site/side marked as required. Patient was prepped and draped in the usual sterile fashion.

## 2022-01-08 ENCOUNTER — Encounter: Payer: Self-pay | Admitting: *Deleted

## 2022-05-11 ENCOUNTER — Encounter: Payer: Self-pay | Admitting: *Deleted

## 2022-11-19 ENCOUNTER — Telehealth: Payer: Self-pay | Admitting: Orthopedic Surgery

## 2022-11-19 NOTE — Telephone Encounter (Signed)
Patient called asked if she can get set up again for the gel injections for her right knee. The number to contact patient is [phone]   Patient has [employer]

## 2022-11-20 NOTE — Telephone Encounter (Signed)
VOB submitted for Euflexxa, right knee  

## 2022-12-01 ENCOUNTER — Other Ambulatory Visit: Payer: Self-pay

## 2022-12-01 DIAGNOSIS — M1711 Unilateral primary osteoarthritis, right knee: Secondary | ICD-10-CM

## 2022-12-02 ENCOUNTER — Telehealth: Payer: Self-pay | Admitting: Orthopedic Surgery

## 2022-12-02 ENCOUNTER — Encounter: Payer: Self-pay | Admitting: Orthopedic Surgery

## 2022-12-02 ENCOUNTER — Ambulatory Visit: Payer: Medicare HMO | Admitting: Orthopedic Surgery

## 2022-12-02 DIAGNOSIS — M1711 Unilateral primary osteoarthritis, right knee: Secondary | ICD-10-CM

## 2022-12-02 NOTE — Telephone Encounter (Signed)
Pt needs to be work in for her last 2 gel injections she can do next Wednesday and the following week for the 3rd injection she can do that Friday please advise if pt can be worked in or if she will need a different provider to complete it

## 2022-12-02 NOTE — Telephone Encounter (Signed)
Talked with patient and appointments have been scheduled.

## 2022-12-02 NOTE — Progress Notes (Signed)
Office Visit Note   Patient: Cynthia Barnett           Date of Birth: 1945-09-05           MRN: [id number] Visit Date: 12/02/2022 Requested by: Lavada Mesi, MD [address] [place],  [place] [address] PCP: Pcp, No  Subjective: Chief Complaint  Patient presents with   Right Knee - Pain    HPI: Cynthia Barnett is a 77 y.o. female who presents to the office reporting right knee pain.  She has bilateral knee pain and known history of arthritis but the right hurts more than the left.  She has had Euflexxa injections in the past which have helped her.  Patient has not been seen in about a year.  Frankincense oil helps.  Also uses Mobic.  She is going to [employer] in several weeks.  Damp weather causes increase in pain..                ROS: All systems reviewed are negative as they relate to the chief complaint within the history of present illness.  Patient denies fevers or chills.  Assessment & Plan: Visit Diagnoses: No diagnosis found.  Plan: Impression is bilateral knee arthritis.  Right knee Euflexxa injection performed today.  Follow-up in 1 week for second injection.  Follow-Up Instructions: No follow-ups on file.   Orders:  No orders of the defined types were placed in this encounter.  No orders of the defined types were placed in this encounter.     Procedures: Large Joint Inj: R knee on 12/02/2022 12:02 PM Indications: pain, joint swelling and diagnostic evaluation Details: 18 G 1.5 in needle, superolateral approach  Arthrogram: No  Medications: 5 mL lidocaine 1 %; 20 mg Sodium Hyaluronate (Viscosup) 20 MG/2ML Outcome: tolerated well, no immediate complications Procedure, treatment alternatives, risks and benefits explained, specific risks discussed. Consent was given by the patient. Immediately prior to procedure a time out was called to verify the correct patient, procedure, equipment, support staff and site/side marked as required. Patient was prepped and  draped in the usual sterile fashion.       Clinical Data: No additional findings.  Objective: Vital Signs: There were no vitals taken for this visit.  Physical Exam:  Constitutional: Patient appears well-developed HEENT:  Head: Normocephalic Eyes:EOM are normal Neck: Normal range of motion Cardiovascular: Normal rate Pulmonary/chest: Effort normal Neurologic: Patient is alert Skin: Skin is warm Psychiatric: Patient has normal mood and affect  Ortho Exam: Ortho exam demonstrates mild flexion contracture in both knees.  Right knee has no effusion.  Does bend past 90.  Extensor mechanism intact.  No nerve root tension signs.  Has patellofemoral crepitus.  No groin pain with internal/external rotation of the leg.  No masses lymphadenopathy or skin changes noted around that right knee region.  Specialty Comments:  No specialty comments available.  Imaging: No results found.   PMFS History: Patient Active Problem List   Diagnosis Date Noted   Fatty infiltration of liver 01/21/2017   Bilateral primary osteoarthritis of knee 12/25/2016   Solitary pulmonary nodule on lung CT 12/25/2016   Postmenopausal HRT (hormone replacement therapy) 03/06/2016   GERD (gastroesophageal reflux disease) 08/25/2012   Left ear hearing loss 02/10/2012   Colon polyps 06/09/2011   Controlled type 2 diabetes mellitus with diabetic neuropathy, without long-term current use of insulin (HCC)    Essential hypertension    Hypothyroidism    Uterine fibroid    Past  Medical History:  Diagnosis Date   Allergy    Arthritis    Colon polyps    Diabetes mellitus    Fatty infiltration of liver 01/21/2017   By CT scan 2018   GERD (gastroesophageal reflux disease) 08/25/2012   Hypertension    Solitary pulmonary nodule on lung CT 12/25/2016   02/2016 - incidental finding; surveillance CT pending   Thyroid disease    Uterine fibroid     Family History  Problem Relation Age of Onset   Arthritis Other     Cancer Other        colon, ovarian, breast and prostate cancer   Heart disease Other    Hypertension Other    Diabetes Other    Cancer Mother        Breast Cancer   Endometrial cancer Mother    Miscarriages / Stillbirths Mother    Arthritis Father    Cancer Father        Basal Cell Carcinoma   Diabetes Father    Heart failure Father    Hypertension Father    Lymphoma Sister    Thyroid disease Daughter    Cancer Son        Basal Cell Carcinoma   Cancer Sister     Past Surgical History:  Procedure Laterality Date   carpal tunnel  about 2005   right, Dr Merlyn Lot   CATARACT EXTRACTION, BILATERAL     DILATION AND CURETTAGE OF UTERUS     EYE SURGERY  05/24/14, 03/20/15   Cataract Surgery   thumb surgury  2003   dr Merlyn Lot   Social History   Occupational History   Occupation: OFFICE MEG    Employer: OFFICE MANAGER  Tobacco Use   Smoking status: Former    Current packs/day: 0.50    Average packs/day: 0.5 packs/day for 2.0 years (1.0 ttl pk-yrs)    Types: Cigarettes   Smokeless tobacco: Never   Tobacco comments:    Stopped over 15 years ago  Vaping Use   Vaping status: Never Used  Substance and Sexual Activity   Alcohol use: Yes    Comment: Occasional wine or mixed drink   Drug use: Never   Sexual activity: Not Currently    Birth control/protection: None

## 2022-12-05 MED ORDER — LIDOCAINE HCL 1 % IJ SOLN
5.0000 mL | INTRAMUSCULAR | Status: AC | PRN
  Administered 2022-12-02: 5 mL

## 2022-12-05 MED ORDER — SODIUM HYALURONATE (VISCOSUP) 20 MG/2ML IX SOSY
20.0000 mg | PREFILLED_SYRINGE | INTRA_ARTICULAR | Status: AC | PRN
  Administered 2022-12-02: 20 mg via INTRA_ARTICULAR

## 2022-12-09 ENCOUNTER — Ambulatory Visit: Payer: Medicare HMO | Admitting: Orthopedic Surgery

## 2022-12-09 DIAGNOSIS — M1711 Unilateral primary osteoarthritis, right knee: Secondary | ICD-10-CM | POA: Diagnosis not present

## 2022-12-10 ENCOUNTER — Encounter: Payer: Self-pay | Admitting: Orthopedic Surgery

## 2022-12-10 NOTE — Progress Notes (Signed)
   Procedure Note  Patient: Cynthia Barnett             Date of Birth: [birth year]           MRN: [id number]             Visit Date: 12/09/2022  Procedures: Visit Diagnoses:  1. Unilateral primary osteoarthritis, right knee     Large Joint Inj: R knee on 12/09/2022 10:51 AM Indications: pain, joint swelling and diagnostic evaluation Details: 18 G 1.5 in needle, superolateral approach  Arthrogram: No  Medications: 5 mL lidocaine 1 %; 20 mg Sodium Hyaluronate (Viscosup) 20 MG/2ML Outcome: tolerated well, no immediate complications Procedure, treatment alternatives, risks and benefits explained, specific risks discussed. Consent was given by the patient. Immediately prior to procedure a time out was called to verify the correct patient, procedure, equipment, support staff and site/side marked as required. Patient was prepped and draped in the usual sterile fashion.

## 2022-12-13 MED ORDER — SODIUM HYALURONATE (VISCOSUP) 20 MG/2ML IX SOSY
20.0000 mg | PREFILLED_SYRINGE | INTRA_ARTICULAR | Status: AC | PRN
  Administered 2022-12-09: 20 mg via INTRA_ARTICULAR

## 2022-12-13 MED ORDER — LIDOCAINE HCL 1 % IJ SOLN
5.0000 mL | INTRAMUSCULAR | Status: AC | PRN
  Administered 2022-12-09: 5 mL

## 2022-12-23 ENCOUNTER — Ambulatory Visit: Payer: Medicare HMO | Admitting: Surgical

## 2022-12-23 ENCOUNTER — Encounter: Payer: Self-pay | Admitting: Orthopedic Surgery

## 2022-12-23 DIAGNOSIS — M1711 Unilateral primary osteoarthritis, right knee: Secondary | ICD-10-CM | POA: Diagnosis not present

## 2022-12-23 MED ORDER — SODIUM HYALURONATE (VISCOSUP) 20 MG/2ML IX SOSY
20.0000 mg | PREFILLED_SYRINGE | INTRA_ARTICULAR | Status: AC | PRN
  Administered 2022-12-23: 20 mg via INTRA_ARTICULAR

## 2022-12-23 MED ORDER — LIDOCAINE HCL 1 % IJ SOLN
5.0000 mL | INTRAMUSCULAR | Status: AC | PRN
  Administered 2022-12-23: 5 mL

## 2022-12-23 NOTE — Progress Notes (Signed)
   Procedure Note  Patient: Cynthia Barnett             Date of Birth: 29-Oct-1945           MRN: [id number]             Visit Date: 12/23/2022  Procedures: Visit Diagnoses:  1. Unilateral primary osteoarthritis, right knee     Large Joint Inj: R knee on 12/23/2022 10:43 AM Indications: pain, joint swelling and diagnostic evaluation Details: 18 G 1.5 in needle, superolateral approach  Arthrogram: No  Medications: 5 mL lidocaine 1 %; 20 mg Sodium Hyaluronate (Viscosup) 20 MG/2ML Outcome: tolerated well, no immediate complications Procedure, treatment alternatives, risks and benefits explained, specific risks discussed. Consent was given by the patient. Immediately prior to procedure a time out was called to verify the correct patient, procedure, equipment, support staff and site/side marked as required. Patient was prepped and draped in the usual sterile fashion.

## 2023-07-26 ENCOUNTER — Other Ambulatory Visit (HOSPITAL_COMMUNITY): Payer: Self-pay

## 2023-07-26 ENCOUNTER — Other Ambulatory Visit: Payer: Self-pay

## 2023-07-26 ENCOUNTER — Other Ambulatory Visit (HOSPITAL_BASED_OUTPATIENT_CLINIC_OR_DEPARTMENT_OTHER): Payer: Self-pay

## 2023-07-26 MED ORDER — SYNJARDY 5-1000 MG PO TABS
1.0000 | ORAL_TABLET | Freq: Two times a day (BID) | ORAL | 1 refills | Status: DC
  Filled 2023-07-26: qty 60, 30d supply, fill #0

## 2023-08-11 ENCOUNTER — Other Ambulatory Visit: Payer: Self-pay | Admitting: Family Medicine

## 2023-08-11 DIAGNOSIS — R1031 Right lower quadrant pain: Secondary | ICD-10-CM

## 2023-08-19 ENCOUNTER — Inpatient Hospital Stay
Admission: RE | Admit: 2023-08-19 | Discharge: 2023-08-19 | Discharge status: Home / Self Care | Source: Ambulatory Visit | Attending: Family Medicine | Admitting: Family Medicine

## 2023-08-19 DIAGNOSIS — R1031 Right lower quadrant pain: Secondary | ICD-10-CM

## 2023-08-19 MED ORDER — IOPAMIDOL (ISOVUE-300) INJECTION 61%
100.0000 mL | Freq: Once | INTRAVENOUS | Status: AC | PRN
  Administered 2023-08-19: 100 mL via INTRAVENOUS

## 2023-08-27 ENCOUNTER — Other Ambulatory Visit (HOSPITAL_COMMUNITY): Payer: Self-pay

## 2023-08-28 ENCOUNTER — Other Ambulatory Visit (HOSPITAL_COMMUNITY): Payer: Self-pay

## 2023-08-28 MED ORDER — EMPAGLIFLOZIN-METFORMIN HCL 5-1000 MG PO TABS
1.0000 | ORAL_TABLET | Freq: Two times a day (BID) | ORAL | 1 refills | Status: DC
  Filled 2023-08-28: qty 60, 30d supply, fill #0
  Filled 2023-10-01: qty 60, 30d supply, fill #1

## 2023-08-30 ENCOUNTER — Other Ambulatory Visit: Payer: Self-pay

## 2023-08-31 ENCOUNTER — Other Ambulatory Visit (HOSPITAL_COMMUNITY): Payer: Self-pay

## 2023-09-01 ENCOUNTER — Other Ambulatory Visit (HOSPITAL_BASED_OUTPATIENT_CLINIC_OR_DEPARTMENT_OTHER): Payer: Self-pay

## 2023-09-01 ENCOUNTER — Other Ambulatory Visit (HOSPITAL_COMMUNITY): Payer: Self-pay

## 2023-09-01 MED ORDER — OZEMPIC (1 MG/DOSE) 4 MG/3ML ~~LOC~~ SOPN
1.0000 mg | PEN_INJECTOR | SUBCUTANEOUS | 6 refills | Status: DC
  Filled 2023-09-01: qty 9, 84d supply, fill #0

## 2023-11-02 ENCOUNTER — Other Ambulatory Visit (HOSPITAL_COMMUNITY): Payer: Self-pay

## 2023-11-02 MED ORDER — EMPAGLIFLOZIN-METFORMIN HCL 5-1000 MG PO TABS
1.0000 | ORAL_TABLET | Freq: Two times a day (BID) | ORAL | 1 refills | Status: DC
  Filled 2023-11-02: qty 60, 30d supply, fill #0
  Filled 2023-12-03: qty 60, 30d supply, fill #1

## 2023-11-17 ENCOUNTER — Other Ambulatory Visit (HOSPITAL_COMMUNITY): Payer: Self-pay

## 2023-11-17 MED ORDER — SEMAGLUTIDE (1 MG/DOSE) 4 MG/3ML ~~LOC~~ SOPN
1.0000 mg | PEN_INJECTOR | SUBCUTANEOUS | 6 refills | Status: AC
  Filled 2023-11-17 (×2): qty 9, 84d supply, fill #0
  Filled 2024-02-11: qty 9, 84d supply, fill #1

## 2023-11-29 ENCOUNTER — Encounter: Payer: Self-pay | Admitting: Radiology

## 2023-12-04 ENCOUNTER — Other Ambulatory Visit (HOSPITAL_COMMUNITY): Payer: Self-pay

## 2024-01-04 ENCOUNTER — Other Ambulatory Visit (HOSPITAL_COMMUNITY): Payer: Self-pay

## 2024-01-04 ENCOUNTER — Other Ambulatory Visit: Payer: Self-pay

## 2024-01-04 MED ORDER — EMPAGLIFLOZIN-METFORMIN HCL 5-1000 MG PO TABS
1.0000 | ORAL_TABLET | Freq: Two times a day (BID) | ORAL | 1 refills | Status: AC
  Filled 2024-01-04: qty 60, 30d supply, fill #0
  Filled 2024-02-07: qty 60, 30d supply, fill #1

## 2024-02-11 ENCOUNTER — Other Ambulatory Visit: Payer: Self-pay

## 2024-02-11 ENCOUNTER — Other Ambulatory Visit (HOSPITAL_COMMUNITY): Payer: Self-pay
